# Patient Record
Sex: Female | Born: 1945 | Race: White | Hispanic: No | Marital: Married | Smoking: Former smoker
Health system: Southern US, Community
[De-identification: ages and names within clinical notes are randomized; demographics above are authoritative.]

## PROBLEM LIST (undated history)

## (undated) DIAGNOSIS — E785 Hyperlipidemia, unspecified: Secondary | ICD-10-CM

## (undated) DIAGNOSIS — E079 Disorder of thyroid, unspecified: Secondary | ICD-10-CM

## (undated) DIAGNOSIS — I10 Essential (primary) hypertension: Secondary | ICD-10-CM

## (undated) DIAGNOSIS — E039 Hypothyroidism, unspecified: Secondary | ICD-10-CM

## (undated) DIAGNOSIS — F419 Anxiety disorder, unspecified: Secondary | ICD-10-CM

## (undated) HISTORY — DX: Hypothyroidism, unspecified: E03.9

## (undated) HISTORY — DX: Essential (primary) hypertension: I10

## (undated) HISTORY — DX: Disorder of thyroid, unspecified: E07.9

## (undated) HISTORY — DX: Anxiety disorder, unspecified: F41.9

## (undated) HISTORY — DX: Hyperlipidemia, unspecified: E78.5

---

## 1978-10-25 DIAGNOSIS — E079 Disorder of thyroid, unspecified: Secondary | ICD-10-CM

## 1978-10-25 HISTORY — PX: THYROIDECTOMY: SHX17

## 1978-10-25 HISTORY — DX: Disorder of thyroid, unspecified: E07.9

## 1984-10-25 HISTORY — PX: ABDOMINAL HYSTERECTOMY: SHX81

## 2001-10-25 HISTORY — PX: CHOLECYSTECTOMY: SHX55

## 2002-08-13 ENCOUNTER — Ambulatory Visit (HOSPITAL_COMMUNITY): Admission: RE | Admit: 2002-08-13 | Discharge: 2002-08-13 | Payer: Self-pay | Admitting: Family Medicine

## 2002-08-13 ENCOUNTER — Encounter: Payer: Self-pay | Admitting: Family Medicine

## 2002-10-09 ENCOUNTER — Encounter: Payer: Self-pay | Admitting: Family Medicine

## 2002-10-09 ENCOUNTER — Ambulatory Visit (HOSPITAL_COMMUNITY): Admission: RE | Admit: 2002-10-09 | Discharge: 2002-10-09 | Payer: Self-pay | Admitting: Family Medicine

## 2002-10-10 ENCOUNTER — Ambulatory Visit (HOSPITAL_COMMUNITY): Admission: RE | Admit: 2002-10-10 | Discharge: 2002-10-10 | Payer: Self-pay | Admitting: General Surgery

## 2003-08-15 ENCOUNTER — Encounter: Payer: Self-pay | Admitting: Family Medicine

## 2003-08-15 ENCOUNTER — Ambulatory Visit (HOSPITAL_COMMUNITY): Admission: RE | Admit: 2003-08-15 | Discharge: 2003-08-15 | Payer: Self-pay | Admitting: Family Medicine

## 2004-07-27 ENCOUNTER — Other Ambulatory Visit: Admission: RE | Admit: 2004-07-27 | Discharge: 2004-07-27 | Payer: Self-pay | Admitting: Orthopaedic Surgery

## 2004-08-17 ENCOUNTER — Ambulatory Visit (HOSPITAL_COMMUNITY): Admission: RE | Admit: 2004-08-17 | Discharge: 2004-08-17 | Payer: Self-pay | Admitting: Family Medicine

## 2004-12-11 ENCOUNTER — Other Ambulatory Visit: Admission: RE | Admit: 2004-12-11 | Discharge: 2004-12-11 | Payer: Self-pay | Admitting: Orthopaedic Surgery

## 2005-08-19 ENCOUNTER — Ambulatory Visit (HOSPITAL_COMMUNITY): Admission: RE | Admit: 2005-08-19 | Discharge: 2005-08-19 | Payer: Self-pay | Admitting: Family Medicine

## 2006-08-22 ENCOUNTER — Ambulatory Visit (HOSPITAL_COMMUNITY): Admission: RE | Admit: 2006-08-22 | Discharge: 2006-08-22 | Payer: Self-pay | Admitting: Family Medicine

## 2006-11-11 ENCOUNTER — Ambulatory Visit (HOSPITAL_COMMUNITY): Admission: RE | Admit: 2006-11-11 | Discharge: 2006-11-11 | Payer: Self-pay | Admitting: Family Medicine

## 2007-08-25 ENCOUNTER — Ambulatory Visit (HOSPITAL_COMMUNITY): Admission: RE | Admit: 2007-08-25 | Discharge: 2007-08-25 | Payer: Self-pay | Admitting: Family Medicine

## 2008-03-29 ENCOUNTER — Ambulatory Visit (HOSPITAL_COMMUNITY): Admission: RE | Admit: 2008-03-29 | Discharge: 2008-03-29 | Payer: Self-pay | Admitting: Family Medicine

## 2008-08-27 ENCOUNTER — Ambulatory Visit (HOSPITAL_COMMUNITY): Admission: RE | Admit: 2008-08-27 | Discharge: 2008-08-27 | Payer: Self-pay | Admitting: Family Medicine

## 2009-04-21 ENCOUNTER — Encounter: Payer: Self-pay | Admitting: Internal Medicine

## 2009-04-30 ENCOUNTER — Ambulatory Visit: Payer: Self-pay | Admitting: Internal Medicine

## 2009-04-30 ENCOUNTER — Ambulatory Visit (HOSPITAL_COMMUNITY): Admission: RE | Admit: 2009-04-30 | Discharge: 2009-04-30 | Payer: Self-pay | Admitting: Internal Medicine

## 2009-04-30 HISTORY — PX: COLONOSCOPY: SHX174

## 2009-09-02 ENCOUNTER — Ambulatory Visit (HOSPITAL_COMMUNITY): Admission: RE | Admit: 2009-09-02 | Discharge: 2009-09-02 | Payer: Self-pay | Admitting: Family Medicine

## 2010-09-07 ENCOUNTER — Ambulatory Visit (HOSPITAL_COMMUNITY): Admission: RE | Admit: 2010-09-07 | Discharge: 2010-09-07 | Payer: Self-pay | Admitting: Family Medicine

## 2010-11-26 NOTE — Letter (Signed)
Summary: Internal Other Domingo Dimes  Internal Other Domingo Dimes   Imported By: Cloria Spring LPN 16/07/9603 54:09:81  _____________________________________________________________________  External Attachment:    Type:   Image     Comment:   External Document

## 2011-03-09 NOTE — Op Note (Signed)
NAME:  Kendra Hancock, SUNDBERG              ACCOUNT NO.:  [id number]   MEDICAL RECORD NO.:  [id number]          PATIENT TYPE:  AMB   LOCATION:  DAY                           FACILITY:  [facility]   PHYSICIAN:  R. Roetta Sessions, M.D. DATE OF BIRTH:  11/05/1945   DATE OF PROCEDURE:  04/30/2009  DATE OF DISCHARGE:                               OPERATIVE REPORT   Screening ileal colonoscopy.   INDICATIONS FOR PROCEDURE:  A 65 year old lady with no lower GU tract  symptoms who comes for colon cancer screening via colonoscopy out of the  courtesy of Dr. Nobie Putnam.  Risks, benefits, alternatives, and  limitations have been discussed, questions answered.  She is agreeable.  Please see documentation in the medical record.   PROCEDURE NOTE:  O2 saturation, blood pressure, pulse, respirations were  monitored throughout the entire procedure.  Conscious sedation with  Versed 6 mg IV, Demerol 100 mg IV in divided doses.  Pentax video chip  system.   FINDINGS:  Digital rectal exam revealed no abnormalities.   ENDOSCOPIC FINDINGS:  The prep was good.   Colon:  Colonic mucosa was surveyed from the rectosigmoid junction  through the left, transverse, right colon to the area of the appendiceal  orifice, ileocecal valve and cecum.  The cecum and appendiceal orifice  were photographed and well seen for the record.  The ileocecal valve was  subtle, and it was not very fatty at all, and the ileocecal valve was  actually patulous but otherwise normal appearing.  The ileocecal valve  was intubated to 5 cm.  The distal terminal ileal mucosa appeared  normal.  From this level, the scope was slowly and cautiously withdrawn.  All previously mentioned mucosal surfaces were again seen.  The patient  had scattered left-sided diverticula.  The remainder of the colonic  mucosa appeared normal.  Scope was pulled down to the rectum where  thorough examination of the rectal mucosa including retroflexion of the  anal verge  demonstrated no abnormalities.  The patient tolerated the  procedure well and was reactive to endoscopy.  Cecal withdrawal time 7  minutes 30 seconds.   IMPRESSION:  1. Normal rectum.  2. Scattered left-sided diverticula.  The remainder of the colonic      mucosa appeared normal.  Somewhat subtle patulous ileocecal valve.      Normal terminal ileum.   RECOMMENDATIONS:  1. Diverticulosis literature provided to Ms. Roseanne Reno.  2. Recommend repeat screening colonoscopy in 10 years.      Jonathon Bellows, M.D.  Electronically Signed     RMR/MEDQ  D:  04/30/2009  T:  04/30/2009  Job:  [id number]   cc:   Patrica Duel, M.D.  Fax: [phone]

## 2011-03-12 NOTE — H&P (Signed)
   NAME:  Kendra Hancock, Kendra Hancock NO.:  [id number]   MEDICAL RECORD NO.:  [id number]                  PATIENT TYPE:   LOCATION:                                       FACILITY:   PHYSICIAN:  Dalia Heading, M.D.               DATE OF BIRTH:  1946/03/15   DATE OF ADMISSION:  10/10/2002  DATE OF DISCHARGE:                                HISTORY & PHYSICAL   CHIEF COMPLAINT:  Cholecystitis secondary to cholelithiasis.   HISTORY OF PRESENT ILLNESS:  The patient is a 65 year old white female who  is referred for evaluation and treatment of cholecystitis secondary to  cholelithiasis.  She has been having right upper quadrant abdominal pain,  nausea, indigestion, and food intolerance for the past few weeks.  She had  an ultrasound of the gallbladder this morning which revealed stones and a  thickened gallbladder wall.  A normal common bile duct was found.  She does  have a positive Murphy's sign.  She denies any fever, chills, or jaundice.   PAST MEDICAL HISTORY:  Hypothyroidism and hypertension.   PAST SURGICAL HISTORY:  Partial thyroidectomy, hysterectomy.   CURRENT MEDICATIONS:  1. Lotrel 5/20 mg p.o. q.d.  2. Synthroid 0.075 mg p.o. q.d.  3. Odgen 1.25 mg p.o. q.d.  4. Hydrochlorothiazide 25 mg p.o. q.d.   ALLERGIES:  AMOXICILLIN.   REVIEW OF SYSTEMS:  Noncontributory.   PHYSICAL EXAMINATION:  GENERAL:  The patient is a well-developed, well-  nourished white female in no acute distress.  VITAL SIGNS:  She is afebrile and vital signs are stable.  HEENT:  Reveals no scleral icterus.  LUNGS:  Clear to auscultation with equal breath sounds bilaterally.  HEART:  Reveals a regular rate and rhythm without S3, S4, or murmurs.  ABDOMEN:  Soft and nondistended.  She is tender in the right upper quadrant  to palpation.  No hepatosplenomegaly, masses, or hernias are noted.   IMPRESSION:  Cholecystitis, cholelithiasis.    PLAN:  The patient is scheduled for  laparoscopic cholecystectomy on October 10, 2002.  The risks and benefits of the procedure including bleeding,  infection, hepatobiliary injury, the possibility of an open procedure were  fully explained to the patient who gave informed consent.                                               Dalia Heading, M.D.    MAJ/MEDQ  D:  10/09/2002  T:  10/10/2002  Job:  [id number]   cc:   Patrica Duel, M.D.  [address], [address]  [place]  [place] [address]  Fax: [phone]

## 2011-03-12 NOTE — Op Note (Signed)
NAME:  Kendra Hancock, Kendra Hancock                        ACCOUNT NO.:  [id number]   MEDICAL RECORD NO.:  [id number]                   PATIENT TYPE:  AMB   LOCATION:  DAY                                  FACILITY:  [facility]   PHYSICIAN:  Dalia Heading, M.D.               DATE OF BIRTH:  08-11-1946   DATE OF PROCEDURE:  10/10/2002  DATE OF DISCHARGE:                                 OPERATIVE REPORT   PREOPERATIVE DIAGNOSIS:  Cholecystitis, cholelithiasis.   POSTOPERATIVE DIAGNOSIS:  Cholecystitis, cholelithiasis.   PROCEDURE:  Laparoscopic cholecystectomy.   SURGEON:  Dalia Heading, M.D.   ASSISTANT:  Bernerd Limbo. Leona Carry, M.D.   ANESTHESIA:  General endotracheal.   COMPLICATIONS:  None.   SPECIMENS:  Gallbladder with stone.   ESTIMATED BLOOD LOSS:  Minimal.   INDICATIONS:  The patient is a 65 year old white female who is referred for  evaluation and treatment of cholecystitis secondary to cholelithiasis.  The  risks and benefits of the procedure, including bleeding, infection,  hepatobiliary injury, and the possibility of an open procedure, were fully  explained to the patient, who gave informed consent.   DESCRIPTION OF PROCEDURE:  The patient was placed in the supine position.  After the induction of general endotracheal anesthesia, the abdomen was  prepped and draped using the usual sterile technique with Betadine.  Surgical site confirmation was performed.   A supraumbilical incision was made down to the fascia.  A Veress needle was  introduced into the abdominal cavity, and confirmation of placement was done  using the saline drop test.  The abdomen was then insufflated to 16 mmHg  pressure.  An 11 mm trocar was introduced into the abdominal cavity under  direct visualization without difficulty.  The patient was placed in reverse  Trendelenburg position and an additional 11 mm trocar was placed in the  epigastric region and 5 mm trocars were placed in the right upper quadrant  and right flank regions.  The liver was inspected and noted to be within  normal limits.  The gallbladder was retracted superiorly and laterally.  The  dissection was done begun around the infundibulum of the gallbladder.  The  cystic duct was first identified and its junction to the infundibulum fully  identified.  Endoclips were placed proximally and distally on the cystic  duct, and the cystic duct was divided.  This was likewise done on the cystic  artery.  The gallbladder was then freed away from the gallbladder fossa  using Bovie electrocautery.  The gallbladder was delivered through the  epigastric trocar site using the EndoCatch bag.  The gallbladder fossa was  inspected, and no abnormal bleeding or bile leakage was noted.  Surgicel was  placed in the gallbladder fossa.  The subhepatic space as well as right  hepatic gutter were irrigated with normal saline.  All fluid and air were  then evacuated from the abdominal cavity prior  to removal of the trocars.   All wounds were irrigated with normal saline.  All wounds were injected with  0.5% Sensorcaine.  The supraumbilical fascia was reapproximated using an 0  Vicryl interrupted suture.  All skin incisions were closed using staples.  Betadine ointment and dry sterile dressings were applied.   All tape and needle counts were correct at the end of the procedure.  The  patient was extubated in the operating room and went back to the recovery  room awake, in stable condition.                                               Dalia Heading, M.D.    MAJ/MEDQ  D:  10/10/2002  T:  10/10/2002  Job:  [id number]   cc:   Patrica Duel, M.D.  [address]  [place]  [place] [address]  Fax: [phone]

## 2011-05-26 ENCOUNTER — Ambulatory Visit (HOSPITAL_COMMUNITY)
Admission: RE | Admit: 2011-05-26 | Discharge: 2011-05-26 | Disposition: A | Discharge status: Home / Self Care | Payer: Medicare Other | Source: Ambulatory Visit | Attending: Cardiology | Admitting: Cardiology

## 2011-05-26 ENCOUNTER — Ambulatory Visit (INDEPENDENT_AMBULATORY_CARE_PROVIDER_SITE_OTHER): Payer: BC Managed Care – HMO | Admitting: Cardiology

## 2011-05-26 ENCOUNTER — Encounter: Payer: Self-pay | Admitting: *Deleted

## 2011-05-26 ENCOUNTER — Encounter: Payer: Self-pay | Admitting: Cardiology

## 2011-05-26 DIAGNOSIS — R079 Chest pain, unspecified: Secondary | ICD-10-CM | POA: Insufficient documentation

## 2011-05-26 DIAGNOSIS — I10 Essential (primary) hypertension: Secondary | ICD-10-CM

## 2011-05-26 DIAGNOSIS — R0602 Shortness of breath: Secondary | ICD-10-CM | POA: Insufficient documentation

## 2011-05-26 DIAGNOSIS — Z7901 Long term (current) use of anticoagulants: Secondary | ICD-10-CM

## 2011-05-26 DIAGNOSIS — Z01818 Encounter for other preprocedural examination: Secondary | ICD-10-CM | POA: Insufficient documentation

## 2011-05-26 DIAGNOSIS — E785 Hyperlipidemia, unspecified: Secondary | ICD-10-CM | POA: Insufficient documentation

## 2011-05-26 LAB — CBC WITH DIFFERENTIAL/PLATELET
Basophils Absolute: 0.1 10*3/uL (ref 0.0–0.1)
Basophils Relative: 1 % (ref 0–1)
Eosinophils Absolute: 0.1 10*3/uL (ref 0.0–0.7)
Eosinophils Relative: 1 % (ref 0–5)
HCT: 38.6 % (ref 36.0–46.0)
Hemoglobin: 13.1 g/dL (ref 12.0–15.0)
Lymphocytes Relative: 39 % (ref 12–46)
Lymphs Abs: 2.8 10*3/uL (ref 0.7–4.0)
MCH: 30.1 pg (ref 26.0–34.0)
MCHC: 33.9 g/dL (ref 30.0–36.0)
MCV: 88.7 fL (ref 78.0–100.0)
Monocytes Absolute: 0.4 10*3/uL (ref 0.1–1.0)
Monocytes Relative: 6 % (ref 3–12)
Neutro Abs: 3.7 10*3/uL (ref 1.7–7.7)
Neutrophils Relative %: 53 % (ref 43–77)
Platelets: 356 10*3/uL (ref 150–400)
RBC: 4.35 MIL/uL (ref 3.87–5.11)
RDW: 12.8 % (ref 11.5–15.5)
WBC: 7.1 10*3/uL (ref 4.0–10.5)

## 2011-05-26 LAB — PROTIME-INR
INR: 0.96 (ref <1.50)
Prothrombin Time: 13 seconds (ref 11.6–15.2)

## 2011-05-26 LAB — BASIC METABOLIC PANEL
BUN: 12 mg/dL (ref 6–23)
CO2: 25 mEq/L (ref 19–32)
Calcium: 10 mg/dL (ref 8.4–10.5)
Chloride: 101 mEq/L (ref 96–112)
Creat: 0.67 mg/dL (ref 0.50–1.10)
Glucose, Bld: 101 mg/dL — ABNORMAL HIGH (ref 70–99)
Potassium: 4.5 mEq/L (ref 3.5–5.3)
Sodium: 139 mEq/L (ref 135–145)

## 2011-05-26 LAB — APTT: aPTT: 36 seconds (ref 24–37)

## 2011-05-26 NOTE — Assessment & Plan Note (Addendum)
Blood pressure control is suboptimal at this visit, but patient attributes that to anxiety.  She has had good values when assessed at home in recent weeks.  No changes will be made to her antihypertensive regimen at present.  I will reassess this nice woman once cardiac catheterization has been completed.

## 2011-05-26 NOTE — Patient Instructions (Signed)
A chest x-ray takes a picture of the organs and structures inside the chest, including the heart, lungs, and blood vessels. This test can show several things, including, whether the heart is enlarges; whether fluid is building up in the lungs; and whether pacemaker / defibrillator leads are still in place.  Your physician recommends that you return for lab work in: today  Your physician has requested that you have a cardiac catheterization. Cardiac catheterization is used to diagnose and/or treat various heart conditions. Doctors may recommend this procedure for a number of different reasons. The most common reason is to evaluate chest pain. Chest pain can be a symptom of coronary artery disease (CAD), and cardiac catheterization can show whether plaque is narrowing or blocking your heart's arteries. This procedure is also used to evaluate the valves, as well as measure the blood flow and oxygen levels in different parts of your heart. For further information please visit [email or web address] Please follow instruction sheet, as given.  Your physician recommends that you schedule a follow-up appointment in: after cath  No strenuous activity and call 911 for prolonged chest pain

## 2011-05-26 NOTE — Progress Notes (Signed)
HPI:  Ms. Buren is seen in the office today at the kind request of Dr. Phillips Odor for evaluation of chest discomfort.  This nice woman has a history of hypertension and hyperlipidemia that have been under excellent control.  She has a remote history of modest tobacco use and a  positive family history for coronary and cerebrovascular disease.  She has not had diabetes nor any known vascular abnormalities.  Over the past 2 months she has developed mild to moderate mid substernal chest pressure when walking rapidly on flat ground or at a normal pace up hill.  She has had some dyspnea as well, not necessarily associated with chest discomfort or exercise.  Discomfort resolves almost immediately with rest.  She has had nitroglycerin for the past few days, but has not used it, as discomfort is so fleeting.  There is been no diaphoresis nor nausea.  No radiation is described.  She has never had similar symptoms, never undergone any significant cardiac testing nor has she been previously evaluated by a cardiologist.  No current outpatient prescriptions on file prior to visit.    Allergies  Allergen Reactions  . Amoxicillin Rash      Past Medical History  Diagnosis Date  . Hypertension     01/2011-normal CBC, CMetand TSH  . Hyperlipidemia     Lipid profile: 162, 83, 58, 87 in 01/2011  . Chest pain 03/2011    Exertional and associated with dizziness and dyspnea     Past Surgical History  Procedure Date  . Cholecystectomy 2003    Laparoscopic  . Abdominal hysterectomy   . Thyroidectomy   . Colonoscopy 2003    few diverticula     No family history on file.   History   Social History  . Marital Status: Married    Spouse Name: N/A    Number of Children: N/A  . Years of Education: N/A   Occupational History  . Not on file.   Social History Main Topics  . Smoking status: Former Games developer  . Smokeless tobacco: Never Used  . Alcohol Use: No  . Drug Use: No  . Sexually Active: Not on file    Other Topics Concern  . Not on file   Social History Narrative  . No narrative on file     ROS:   Occasional dizziness,   All other systems reviewed and are negative.  PHYSICAL EXAM: BP 164/79  Pulse 63  Ht 5\' 2"  (1.575 m)  Wt 67.586 kg (149 lb)  BMI 27.25 kg/m2  SpO2 100%  General-Well-developed; no acute distress Body Habitus-proportionate weight and height HEENT-/AT; PERRL; EOM intact; conjunctiva and lids nl Neck-No JVD; no carotid bruits Endocrine-No thyromegaly Lungs-Clear lung fields; resonant percussion; normal I-to-E ratio Cardiovascular- normal PMI; normal S1 and S2; modest systolic ejection murmur Abdomen-BS normal; soft and non-tender without masses or organomegaly Musculoskeletal-No deformities, cyanosis or clubbing Neurologic-Nl cranial nerves; symmetric strength and tone Skin- Warm, no significant lesions Extremities-Nl distal pulses; no edema  EKG:  Sinus bradycardia at a rate of 59; left atrial abnormality; delayed R-wave progression.  No previous tracing for comparison.   ASSESSMENT AND PLAN:

## 2011-05-26 NOTE — Assessment & Plan Note (Addendum)
Patient's description of chest discomfort is classic for exertional angina.  There has been some progression of symptoms such that discomfort is occurring at a lower exercise threshold, but the change has not been dramatic.  With brief and self-limited episodes only occurring during exertion, she does not appear to be at particular risk for a major morbidity event.  I explained the benefits and risks of cardiac catheterization vs empiric medical therapy.  In order to define her anatomy and exclude high risk disease, I've recommended cardiac catheterization.  She and her husband agreed to that course of action.  Precatheterization laboratories will be obtained.  She has been advised to limit activity and use nitroglycerin for any episodes that not pass quickly.  For prolonged or severe symptoms, she is advised to call EMS.  Outpatient catheterization will be arranged to proceed within the next week.

## 2011-05-26 NOTE — Assessment & Plan Note (Signed)
Hyperlipidemia must be mild, since lipid values are good with a minimal dosage of simvastatin.  I would be inclined to intensify lipid-lowering therapy, particularly if angiography documents coronary disease as expected.

## 2011-05-31 ENCOUNTER — Inpatient Hospital Stay (HOSPITAL_BASED_OUTPATIENT_CLINIC_OR_DEPARTMENT_OTHER)
Admission: RE | Admit: 2011-05-31 | Discharge: 2011-05-31 | Disposition: A | Discharge status: Home / Self Care | Payer: Medicare Other | Source: Ambulatory Visit | Attending: Cardiovascular Disease | Admitting: Cardiovascular Disease

## 2011-05-31 DIAGNOSIS — E785 Hyperlipidemia, unspecified: Secondary | ICD-10-CM | POA: Insufficient documentation

## 2011-05-31 DIAGNOSIS — R079 Chest pain, unspecified: Secondary | ICD-10-CM

## 2011-05-31 DIAGNOSIS — R0609 Other forms of dyspnea: Secondary | ICD-10-CM | POA: Insufficient documentation

## 2011-05-31 DIAGNOSIS — I10 Essential (primary) hypertension: Secondary | ICD-10-CM | POA: Insufficient documentation

## 2011-05-31 DIAGNOSIS — R0989 Other specified symptoms and signs involving the circulatory and respiratory systems: Secondary | ICD-10-CM | POA: Insufficient documentation

## 2011-06-01 ENCOUNTER — Ambulatory Visit: Payer: BC Managed Care – HMO | Admitting: Adult Health

## 2011-06-01 NOTE — Cardiovascular Report (Signed)
  NAME:  Kendra Hancock, Kendra Hancock NO.:  [id number]  MEDICAL RECORD NO.:  [id number]  LOCATION:                                 FACILITY:  PHYSICIAN:  Verne Carrow, MDDATE OF BIRTH:  [birth year]  DATE OF PROCEDURE:  05/31/2011 DATE OF DISCHARGE:                           CARDIAC CATHETERIZATION   PRIMARY CARDIOLOGIST:  Gerrit Friends. Dietrich Pates, MD, [facility]  PRIMARY CARE PHYSICIAN:  Corrie Mckusick, MD  PROCEDURES PERFORMED: 1. Left heart catheterization. 2. Selective coronary angiography. 3. Left ventricular angiogram.  OPERATOR:  Verne Carrow, MD  INDICATIONS:  This is a 65 year old Caucasian female with a history of hypertension and hyperlipidemia who has recently had episodes of exertional chest pain associated with dyspnea.  She was referred for diagnostic catheterization by Dr. Teague Bing.  PROCEDURE IN DETAIL:  The patient was brought to the outpatient cardiac catheterization laboratory after signing informed consent for the procedure.  The right groin was prepped and draped in sterile fashion. Lidocaine 1% was used for local anesthesia.  A 4-French sheath was inserted into the right femoral artery without difficulty.  Standard diagnostic catheters were used to perform selective coronary angiography.  A pigtail catheter was used to perform a left ventricular angiogram.  The patient tolerated the procedure well and was taken to the recovery room in stable condition.  HEMODYNAMIC FINDINGS:  Central aortic pressure 139/57, left ventricular pressure 144/50/17.  ANGIOGRAPHIC FINDINGS: 1. The left main coronary artery was free of any disease. 2. The left anterior descending was a moderate-sized vessel that     coursed to the apex and wrapped around the apex.  There was a     moderate-sized diagonal branch that arose from the LAD.  There was     no disease in this system. 3. The circumflex artery gave off several small-caliber obtuse     marginal  branches.  The AV groove circumflex was moderate in size.     This was a dominant system.  There was no evidence of disease in     this system. 4. The right coronary artery was a small nondominant vessel with no     disease. 5. Left ventricular angiogram was performed in the RAO projection and     showed normal left ventricular systolic function with ejection     fraction of 55-60%.  IMPRESSION: 1. No angiographic evidence of coronary artery disease. 2. Normal left ventricular systolic function.  RECOMMENDATIONS:  I do not recommend any further ischemic workup at this time.     Verne Carrow, MD     CM/MEDQ  D:  05/31/2011  T:  05/31/2011  Job:  [id number]  cc:   Gerrit Friends. Dietrich Pates, MD, [facility] Corrie Mckusick, M.D.  Electronically Signed by Verne Carrow MD on 06/01/2011 01:38:34 PM

## 2011-06-18 ENCOUNTER — Encounter: Payer: Self-pay | Admitting: *Deleted

## 2011-06-18 ENCOUNTER — Telehealth: Payer: Self-pay | Admitting: *Deleted

## 2011-06-18 NOTE — Telephone Encounter (Signed)
Message copied by Mikaia Janvier L on Fri Jun 18, 2011 12:10 PM ------      Message from: Kathlen Brunswick      Created: Thu May 27, 2011 11:17 AM       Lab results reviewed.      No change in Rx.      No significant abnormalities.

## 2011-06-18 NOTE — Telephone Encounter (Signed)
No answer on phone number listed. Will mail letter to pt.

## 2011-06-25 ENCOUNTER — Ambulatory Visit (INDEPENDENT_AMBULATORY_CARE_PROVIDER_SITE_OTHER): Payer: Medicare Other | Admitting: Cardiology

## 2011-06-25 ENCOUNTER — Encounter: Payer: Self-pay | Admitting: Cardiology

## 2011-06-25 DIAGNOSIS — E785 Hyperlipidemia, unspecified: Secondary | ICD-10-CM

## 2011-06-25 DIAGNOSIS — I10 Essential (primary) hypertension: Secondary | ICD-10-CM

## 2011-06-25 DIAGNOSIS — R079 Chest pain, unspecified: Secondary | ICD-10-CM

## 2011-06-25 NOTE — Progress Notes (Signed)
HPI : Ms. Bristow returns to the office following cardiac catheterization.  The results of that study were entirely benign with no coronary artery disease identified.  Since her last office visit, she has had no recurrent chest discomfort and feels fine.  Current Outpatient Prescriptions on File Prior to Visit  Medication Sig Dispense Refill  . amLODipine (NORVASC) 5 MG tablet Take 5 mg by mouth daily.        Marland Kitchen aspirin 81 MG tablet Take 81 mg by mouth daily.        Marland Kitchen estropipate (OGEN) 1.5 MG tablet Take 1.5 mg by mouth daily.       . hydrochlorothiazide 25 MG tablet Take 25 mg by mouth daily.       Marland Kitchen levothyroxine (SYNTHROID, LEVOTHROID) 75 MCG tablet Take 75 mcg by mouth daily.       Marland Kitchen NITROSTAT 0.4 MG SL tablet Place 0.4 mg under the tongue every 5 (five) minutes as needed.       . simvastatin (ZOCOR) 10 MG tablet Take 10 mg by mouth at bedtime.           Allergies  Allergen Reactions  . Amoxicillin Rash      Past medical history, social history, and family history reviewed and updated.  ROS: No dyspnea, orthopnea, PND, pedal edema or discomfort at the cardiac catheterization site.  PHYSICAL EXAM: BP 164/66  Pulse 53  Resp 18  Ht 5\' 3"  (1.6 m)  Wt 68.402 kg (150 lb 12.8 oz)  BMI 26.71 kg/m2  SpO2 96%  General-Well developed; no acute distress Body habitus-proportionate weight and height Neck-No JVD; no carotid bruits Lungs-clear lung fields; resonant to percussion Cardiovascular-normal PMI; normal S1 and S2; grade 1/6 early systolic ejection murmur at the cardiac base Abdomen-normal bowel sounds; soft and non-tender without masses or organomegaly Musculoskeletal-No deformities, no cyanosis or clubbing Neurologic-Normal cranial nerves; symmetric strength and tone Skin-Warm, no significant lesions Extremities-distal pulses intact; no edema; no bruising or other abnormality at the right femoral artery catheterization site   Lab:  CBC, complete metabolic profile, PT,  PTT-normal CXR:  Calcification in the aortic arch; scoliosis; right upper quadrant surgical clips  ASSESSMENT AND PLAN:

## 2011-06-25 NOTE — Assessment & Plan Note (Signed)
Patient appears to have a component of white coat hypertension, but reports frequent blood pressures at home all with systolics less than 140.  She does not require any change in her antihypertensive medication.

## 2011-06-25 NOTE — Assessment & Plan Note (Signed)
Chest pain was quite worrisome in terms of its quality and relationship to exertion, but apparently did not represent angina.  She does not appear to meet criteria for syndrome X. Other cardiac issues such as coronary artery spasm continue to represent possible diagnoses, but in the absence of symptoms I would not be inclined to pursue further diagnostic testing or treatment.  I will see this nice woman again on a p.r.n basis.

## 2011-06-25 NOTE — Patient Instructions (Signed)
Your physician recommends that you continue on your current medications as directed. Please refer to the Current Medication list given to you today.  Your physician recommends that you schedule a follow-up appointment in: as needed  

## 2011-06-25 NOTE — Assessment & Plan Note (Signed)
Lipid profile: 162, 83, 58, 87 in 01/2011 Excellent control.

## 2011-08-10 ENCOUNTER — Other Ambulatory Visit (HOSPITAL_COMMUNITY): Payer: Self-pay | Admitting: Family Medicine

## 2011-08-10 DIAGNOSIS — Z139 Encounter for screening, unspecified: Secondary | ICD-10-CM

## 2011-08-23 ENCOUNTER — Other Ambulatory Visit (HOSPITAL_COMMUNITY): Payer: Self-pay | Admitting: Family Medicine

## 2011-08-23 DIAGNOSIS — Z139 Encounter for screening, unspecified: Secondary | ICD-10-CM

## 2011-09-13 ENCOUNTER — Ambulatory Visit (HOSPITAL_COMMUNITY)
Admission: RE | Admit: 2011-09-13 | Discharge: 2011-09-13 | Disposition: A | Discharge status: Home / Self Care | Payer: Medicare Other | Source: Ambulatory Visit | Attending: Family Medicine | Admitting: Family Medicine

## 2011-09-13 DIAGNOSIS — Z139 Encounter for screening, unspecified: Secondary | ICD-10-CM

## 2011-09-13 DIAGNOSIS — Z780 Asymptomatic menopausal state: Secondary | ICD-10-CM | POA: Insufficient documentation

## 2011-09-13 DIAGNOSIS — Z1231 Encounter for screening mammogram for malignant neoplasm of breast: Secondary | ICD-10-CM | POA: Insufficient documentation

## 2011-09-13 DIAGNOSIS — M949 Disorder of cartilage, unspecified: Secondary | ICD-10-CM | POA: Insufficient documentation

## 2011-09-13 DIAGNOSIS — M899 Disorder of bone, unspecified: Secondary | ICD-10-CM | POA: Insufficient documentation

## 2011-10-04 ENCOUNTER — Encounter: Payer: Self-pay | Admitting: Internal Medicine

## 2011-10-06 ENCOUNTER — Encounter: Payer: Self-pay | Admitting: Urgent Care

## 2011-10-06 ENCOUNTER — Ambulatory Visit (INDEPENDENT_AMBULATORY_CARE_PROVIDER_SITE_OTHER): Payer: Medicare Other | Admitting: Urgent Care

## 2011-10-06 VITALS — BP 144/74 | HR 60 | Temp 97.9°F | Ht 62.0 in | Wt 150.8 lb

## 2011-10-06 DIAGNOSIS — K921 Melena: Secondary | ICD-10-CM

## 2011-10-06 DIAGNOSIS — K649 Unspecified hemorrhoids: Secondary | ICD-10-CM

## 2011-10-06 MED ORDER — HYDROCORTISONE ACETATE 25 MG RE SUPP: 25.0000 mg | Freq: Two times a day (BID) | RECTAL | Status: AC

## 2011-10-06 NOTE — Assessment & Plan Note (Addendum)
Kendra Hancock is a pleasant 65 y.o. female with rectal bleeding felt to be due to active hemorrhoids. Will treat her hemorrhoids and if she remains symptomatic, she will need further evaluation. Last colonoscopy 2010, she did have diverticulosis. Anusol HC suppositories 1 per rectum twice a day for 10 days If persistent bleeding or new symptoms, she should call us Hemorrhoid literature

## 2011-10-06 NOTE — Patient Instructions (Signed)
You have a large hemorrhoid. Anusol-HC suppositories 1 per rectum twice a day. Call if the bleeding persists or he develop any new symptoms.  Hemorrhoids Hemorrhoids are enlarged (dilated) veins around the rectum. There are 2 types of hemorrhoids, and the type of hemorrhoid is determined by its location. Internal hemorrhoids occur in the veins just inside the rectum.They are usually not painful, but they may bleed.However, they may poke through to the outside and become irritated and painful. External hemorrhoids involve the veins outside the anus and can be felt as a painful swelling or hard lump near the anus.They are often itchy and may crack and bleed. Sometimes clots will form in the veins. This makes them swollen and painful. These are called thrombosed hemorrhoids. CAUSES Causes of hemorrhoids include:  Pregnancy. This increases the pressure in the hemorrhoidal veins.   Constipation.   Straining to have a bowel movement.   Obesity.   Heavy lifting or other activity that caused you to strain.  TREATMENT Most of the time hemorrhoids improve in 1 to 2 weeks. However, if symptoms do not seem to be getting better or if you have a lot of rectal bleeding, your caregiver may perform a procedure to help make the hemorrhoids get smaller or remove them completely.Possible treatments include:  Rubber band ligation. A rubber band is placed at the base of the hemorrhoid to cut off the circulation.   Sclerotherapy. A chemical is injected to shrink the hemorrhoid.   Infrared light therapy. Tools are used to burn the hemorrhoid.   Hemorrhoidectomy. This is surgical removal of the hemorrhoid.  HOME CARE INSTRUCTIONS   Increase fiber in your diet. Ask your caregiver about using fiber supplements.   Drink enough water and fluids to keep your urine clear or pale yellow.   Exercise regularly.   Go to the bathroom when you have the urge to have a bowel movement. Do not wait.   Avoid  straining to have bowel movements.   Keep the anal area dry and clean.   Only take over-the-counter or prescription medicines for pain, discomfort, or fever as directed by your caregiver.  If your hemorrhoids are thrombosed:  Take warm sitz baths for 20 to 30 minutes, 3 to 4 times per day.   If the hemorrhoids are very tender and swollen, place ice packs on the area as tolerated. Using ice packs between sitz baths may be helpful. Fill a plastic bag with ice. Place a towel between the bag of ice and your skin.   Medicated creams and suppositories may be used or applied as directed.   Do not use a donut-shaped pillow or sit on the toilet for long periods. This increases blood pooling and pain.  SEEK MEDICAL CARE IF:   You have increasing pain and swelling that is not controlled with your medicine.   You have uncontrolled bleeding.   You have difficulty or you are unable to have a bowel movement.   You have pain or inflammation outside the area of the hemorrhoids.   You have chills or an oral temperature above 102 F (38.9 C).  MAKE SURE YOU:   Understand these instructions.   Will watch your condition.   Will get help right away if you are not doing well or get worse.  Document Released: 10/08/2000 Document Revised: 06/23/2011 Document Reviewed: 02/13/2008 [facility] Patient Information 2012 [place], [place].

## 2011-10-06 NOTE — Progress Notes (Signed)
Referring Provider: Colette Ribas, MD Primary Care Physician:  Colette Ribas, MD Primary Gastroenterologist:  Dr. Jena Gauss  Chief Complaint  Patient presents with  . Rectal Bleeding  . Hemorrhoids    HPI:  Kendra Hancock is a 65 y.o. female here as a referral from Dr. Phillips Odor for rectal bleeding.  She went to the bathroom 6 days ago and noticed a large amount of blood in the commode. The following day she had intermittent bleeding off and on for several days. She noticed the blood was mixed with her bowel movement as well as in the commode and on the tissue. She described it as bright red blood ranges from small to large amts w/ clots.  Denies proctalgia or rectal pruritis.  Denies abd pain.  Denies constipation or diarrhea.  Denies N/V.  Denies HB or indigestion.  Dysphagia or odynophagia.  Wt stable.  Appetite.  Denies fatigue.  IBU 200mg  once week aching in legs.    Past Medical History  Diagnosis Date  . Hypertension     01/2011-normal CBC, CMetand TSH  . Hyperlipidemia     Lipid profile: 162, 83, 58, 87 in 01/2011  . Chest pain 03/2011    Exertional and associated with dizziness and dyspnea  . Thyroid disease 1980    s/p partial thyroidectomy   Past Surgical History  Procedure Date  . Cholecystectomy 2003    Laparoscopic  . Abdominal hysterectomy 1986  . Thyroidectomy 1980  . Colonoscopy 04/30/2009    normal rectum/ left sided diverticula/subtle patulous ICV   Current Outpatient Prescriptions  Medication Sig Dispense Refill  . amLODipine (NORVASC) 5 MG tablet Take 5 mg by mouth daily.        Marland Kitchen aspirin 81 MG tablet Take 81 mg by mouth daily.        Marland Kitchen estropipate (OGEN) 1.5 MG tablet Take 1.5 mg by mouth daily.       . hydrochlorothiazide 25 MG tablet Take 25 mg by mouth daily.       Marland Kitchen levothyroxine (SYNTHROID, LEVOTHROID) 75 MCG tablet Take 75 mcg by mouth daily.       Marland Kitchen lisinopril (PRINIVIL,ZESTRIL) 20 MG tablet Take 20 mg by mouth daily.        Marland Kitchen NITROSTAT 0.4 MG SL  tablet Place 0.4 mg under the tongue every 5 (five) minutes as needed.       . simvastatin (ZOCOR) 10 MG tablet Take 10 mg by mouth at bedtime.         Allergies as of 10/06/2011 - Review Complete 10/06/2011  Allergen Reaction Noted  . Amoxicillin Rash 05/26/2011   Family History  Problem Relation Age of Onset  . Heart failure Mother   . Stroke Father    History   Social History  . Marital Status: Married    Spouse Name: N/A    Number of Children: 1  . Years of Education: N/A   Occupational History  . Homemaker    Social History Main Topics  . Smoking status: Former Smoker    Types: Cigarettes    Quit date: 10/25/1978  . Smokeless tobacco: Never Used  . Alcohol Use: No  . Drug Use: No  . Sexually Active: Not on file  Review of Systems: Gen: Denies any fever, chills, sweats, anorexia, fatigue, weakness, malaise, weight loss, and sleep disorder CV: Denies chest pain, angina, palpitations, syncope, orthopnea, PND, peripheral edema, and claudication. Resp: Denies dyspnea at rest, dyspnea with exercise, cough, sputum, wheezing, coughing up  blood, and pleurisy. GI: Denies vomiting blood, jaundice, and fecal incontinence.   Denies dysphagia or odynophagia. GU : Denies urinary burning, blood in urine, urinary frequency, urinary hesitancy, nocturnal urination, and urinary incontinence. MS: Denies joint pain, limitation of movement, and swelling, stiffness, low back pain, extremity pain. Denies muscle weakness, cramps, atrophy.  Derm: Denies rash, itching, dry skin, hives, moles, warts, or unhealing ulcers.  Psych: Denies depression, anxiety, memory loss, suicidal ideation, hallucinations, paranoia, and confusion. Heme: Denies bruising, and enlarged lymph nodes. Neuro:  Denies any headaches, dizziness, paresthesias. Endo:  Denies any problems with DM, thyroid, adrenal function.  Physical Exam: BP 144/74  Pulse 60  Temp(Src) 97.9 F (36.6 C) (Temporal)  Ht 5\' 2"  (1.575 m)  Wt  150 lb 12.8 oz (68.402 kg)  BMI 27.58 kg/m2 General:   Alert,  Well-developed, well-nourished, pleasant and cooperative in NAD Head:  Normocephalic and atraumatic. Eyes:  Sclera clear, no icterus.   Conjunctiva pink. Ears:  Normal auditory acuity. Nose:  No deformity, discharge, or lesions. Mouth:  No deformity or lesions,oropharynx pink & moist. Neck:  Supple; no masses or thyromegaly. Lungs:  Clear throughout to auscultation.   No wheezes, crackles, or rhonchi. No acute distress. Heart:  Regular rate and rhythm; no murmurs, clicks, rubs,  or gallops. Abdomen:  Normal bowel sounds.  No bruits.  Soft, non-tender and non-distended without masses, hepatosplenomegaly or hernias noted.  No guarding or rebound tenderness.   Rectal:  Multiple small hemorrhoids and one large nonbleeding, nontender external hemorrhoid at 9 o' clock. Msk:  Symmetrical without gross deformities. Normal posture. Pulses:  Normal pulses noted. Extremities:  No clubbing or edema. Neurologic:  Alert and oriented x4;  grossly normal neurologically. Skin:  Intact without significant lesions or rashes. Lymph Nodes:  No significant cervical adenopathy. Psych:  Alert and cooperative. Normal mood and affect.

## 2011-10-06 NOTE — Assessment & Plan Note (Signed)
Suspected secondary to large hemorrhoids on exam.

## 2011-10-07 NOTE — Progress Notes (Signed)
Cc to PCP 

## 2012-08-09 ENCOUNTER — Other Ambulatory Visit (HOSPITAL_COMMUNITY): Payer: Self-pay | Admitting: Family Medicine

## 2012-08-09 DIAGNOSIS — Z139 Encounter for screening, unspecified: Secondary | ICD-10-CM

## 2012-09-15 ENCOUNTER — Ambulatory Visit (HOSPITAL_COMMUNITY)
Admission: RE | Admit: 2012-09-15 | Discharge: 2012-09-15 | Disposition: A | Discharge status: Home / Self Care | Payer: Medicare Other | Source: Ambulatory Visit | Attending: Family Medicine | Admitting: Family Medicine

## 2012-09-15 DIAGNOSIS — Z139 Encounter for screening, unspecified: Secondary | ICD-10-CM

## 2012-09-15 DIAGNOSIS — Z1231 Encounter for screening mammogram for malignant neoplasm of breast: Secondary | ICD-10-CM | POA: Insufficient documentation

## 2012-11-09 ENCOUNTER — Ambulatory Visit (INDEPENDENT_AMBULATORY_CARE_PROVIDER_SITE_OTHER): Payer: Medicare Other | Admitting: Orthopedic Surgery

## 2012-11-09 ENCOUNTER — Ambulatory Visit (INDEPENDENT_AMBULATORY_CARE_PROVIDER_SITE_OTHER): Payer: Medicare Other

## 2012-11-09 ENCOUNTER — Encounter: Payer: Self-pay | Admitting: Orthopedic Surgery

## 2012-11-09 VITALS — BP 148/58 | Ht 62.0 in | Wt 151.8 lb

## 2012-11-09 DIAGNOSIS — M7712 Lateral epicondylitis, left elbow: Secondary | ICD-10-CM

## 2012-11-09 DIAGNOSIS — M25529 Pain in unspecified elbow: Secondary | ICD-10-CM

## 2012-11-09 DIAGNOSIS — M7710 Lateral epicondylitis, unspecified elbow: Secondary | ICD-10-CM

## 2012-11-09 NOTE — Patient Instructions (Signed)
Apply Max freeze 3 x a day   Follow home exercises daily   Wear brace x 6 weeks

## 2012-11-09 NOTE — Progress Notes (Signed)
Patient ID: Kendra Hancock, female   DOB: 1945-11-30, 67 y.o.   MRN: [id number] Chief Complaint  Patient presents with  . Elbow Pain    Left elbow pain, bone feels sore, no injury    Medical history recorded and reviewed.  Review of systems negative.  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 Her pain came on gradually he is described as dull Travatan constant pain, worse in the morning, associated with stiffness and loss of motion, especially elbow extension.  BP 148/58  Ht 5\' 2"  (1.575 m)  Wt 151 lb 12.8 oz (68.856 kg)  BMI 27.76 kg/m2 Physical Exam(12)  GENERAL: normal development   CDV: pulses are normal   Skin: normal  Lymph: nodes were not palpable/normal  Psychiatric: awake, alert and oriented  Neuro: normal sensation  MSK  Gait: Is normal 1 Inspection There is no swelling, but there is tenderness over the lateral epicondyles and lateral elbow musculature 2 Range of Motion Range of motion shows extension deficit 3 Motor Grade 5 4 Stability Normal  Other side:  5 Normal motion 6 Appearance is normal  Imaging Normal  Assessment: Lateral epicondylitis, LEFT elbow    Plan: Recommend injection, a tennis elbow brace, maxillary, 3 times a day, and [facility] home exercises x6 weeks followup 6 weeks  Elbow injections: Procedure Note  Pre-operative Diagnosis: left  tennis elbow  Post-operative Diagnosis: same  Indications: pain   Anesthesia: ethyl chloride   Procedure Details   Verbal consent was obtained and then a timeout was completed  Verbal consent was obtained for the procedure. The point of maximum tenderness was identified and marked. The skin prepped with alcohol.  Depo-Medrol 40 mg and lidocaine 1% 3 mls was injected.  Complications:  None; patient tolerated the procedure well.

## 2012-12-21 ENCOUNTER — Ambulatory Visit (INDEPENDENT_AMBULATORY_CARE_PROVIDER_SITE_OTHER): Payer: Medicare Other | Admitting: Orthopedic Surgery

## 2012-12-21 VITALS — BP 122/56 | Ht 62.0 in | Wt 151.0 lb

## 2012-12-21 DIAGNOSIS — M7710 Lateral epicondylitis, unspecified elbow: Secondary | ICD-10-CM

## 2012-12-21 DIAGNOSIS — M7712 Lateral epicondylitis, left elbow: Secondary | ICD-10-CM

## 2012-12-21 NOTE — Progress Notes (Signed)
Patient ID: Kendra Hancock, female   DOB: 11/25/1945, 67 y.o.   MRN: [id number] Chief Complaint  Patient presents with  . Follow-up    6 week recheck on left elbow.   BP 122/56  Ht 5\' 2"  (1.575 m)  Wt 151 lb (68.493 kg)  BMI 27.61 kg/m2  Followup for tennis elbow treated with exercise program and bracing and injection she reports no pain  She has full range of motion and no tenderness  Impression lateral tennis elbow left  Improved  Discharge

## 2012-12-21 NOTE — Patient Instructions (Addendum)
activities as tolerated  Use brace as needed

## 2013-09-17 ENCOUNTER — Other Ambulatory Visit (HOSPITAL_COMMUNITY): Payer: Self-pay | Admitting: Family Medicine

## 2013-09-17 DIAGNOSIS — Z139 Encounter for screening, unspecified: Secondary | ICD-10-CM

## 2013-09-24 ENCOUNTER — Ambulatory Visit (HOSPITAL_COMMUNITY)
Admission: RE | Admit: 2013-09-24 | Discharge: 2013-09-24 | Disposition: A | Discharge status: Home / Self Care | Payer: Medicare Other | Source: Ambulatory Visit | Attending: Family Medicine | Admitting: Family Medicine

## 2013-09-24 DIAGNOSIS — Z139 Encounter for screening, unspecified: Secondary | ICD-10-CM

## 2013-09-24 DIAGNOSIS — Z1231 Encounter for screening mammogram for malignant neoplasm of breast: Secondary | ICD-10-CM | POA: Insufficient documentation

## 2014-03-28 ENCOUNTER — Ambulatory Visit (INDEPENDENT_AMBULATORY_CARE_PROVIDER_SITE_OTHER): Payer: Medicare Other | Admitting: Orthopedic Surgery

## 2014-03-28 ENCOUNTER — Encounter: Payer: Self-pay | Admitting: Orthopedic Surgery

## 2014-03-28 DIAGNOSIS — M6530 Trigger finger, unspecified finger: Secondary | ICD-10-CM

## 2014-03-28 NOTE — Progress Notes (Signed)
Patient ID: Kendra Hancock, female   DOB: Apr 19, 1946, 68 y.o.   MRN: [id number] Chief Complaint  Patient presents with  . Hand Problem    Right index finger 3-4 months catching locking and triggering    HISTORY: 68 year old female new problem pain over the A1 pulley of the right index finger with catching locking and a.m. Stiffness.  No trauma. She's had previous trigger fingers treated by surgical release  Past medical history of hypertension thyroid disease  Previous hysterectomy thyroidectomy cholecystectomy and multiple trigger finger releases x4  Current medications include lisinopril 20 hydrochlorothiazide 25 of the proximal and 75 simvastatin 20 aspirin 81  Allergies amoxicillin causes rash family history of hypertension choked thyroid disease  Parents are deceased  Review of systems vision difficulties thyroid difficulties and symptoms prior to previous surgery otherwise normal   General the patient is well-developed and well-nourished grooming and hygiene are normal Oriented x3 Mood and affect normal Ambulation noncontributory but normal Inspection of the right index finger tenderness over the A1 pulley full range of motion with some catching sensation. Flexor tendons are intact skin is clean dry and intact capillary refill is normal good radial pulse normal sensation no lymphadenopathy in the arm  Cardiovascular exam is normal Sensory exam normal  Encounter Diagnosis  Name Primary?  . Trigger finger, acquired Yes    Procedure note trigger finger injection  Diagnosis trigger finger Postop diagnosis trigger finger Procedure injection of trigger finger We injected the right index finger Details of procedure: After verbal consent and timeout to confirm site the right index finger was injected with 1 cc of 40 mg of Depo-Medrol and 1 cc of 1% lidocaine  The procedure was tolerated well without complication

## 2014-03-28 NOTE — Patient Instructions (Signed)
Call in two weeks if your still having symtoms

## 2014-04-30 ENCOUNTER — Ambulatory Visit (INDEPENDENT_AMBULATORY_CARE_PROVIDER_SITE_OTHER): Payer: Medicare Other | Admitting: Orthopedic Surgery

## 2014-04-30 ENCOUNTER — Encounter: Payer: Self-pay | Admitting: Orthopedic Surgery

## 2014-04-30 VITALS — BP 137/68 | Ht 62.0 in | Wt 151.0 lb

## 2014-04-30 DIAGNOSIS — M6530 Trigger finger, unspecified finger: Secondary | ICD-10-CM

## 2014-04-30 NOTE — Progress Notes (Signed)
Patient ID: Kendra Hancock, female   DOB: 03-03-1946, 68 y.o.   MRN: [id number] Recheck right next finger for triggering. The patient reports no triggering some stiffness in the morning and at the end of the day with heavy use. She has no tenderness in the palm.  Examination reveals no tenderness no catching no locking.  It is okay for her to followup with [place] on an as-needed basis

## 2014-09-23 ENCOUNTER — Other Ambulatory Visit (HOSPITAL_COMMUNITY): Payer: Self-pay | Admitting: Family Medicine

## 2014-09-23 DIAGNOSIS — Z1231 Encounter for screening mammogram for malignant neoplasm of breast: Secondary | ICD-10-CM

## 2014-09-26 ENCOUNTER — Ambulatory Visit (HOSPITAL_COMMUNITY)
Admission: RE | Admit: 2014-09-26 | Discharge: 2014-09-26 | Disposition: A | Discharge status: Home / Self Care | Payer: Medicare Other | Source: Ambulatory Visit | Attending: Family Medicine | Admitting: Family Medicine

## 2014-09-26 DIAGNOSIS — Z1231 Encounter for screening mammogram for malignant neoplasm of breast: Secondary | ICD-10-CM

## 2015-03-06 ENCOUNTER — Ambulatory Visit: Payer: Self-pay | Admitting: Orthopedic Surgery

## 2015-09-08 ENCOUNTER — Other Ambulatory Visit (HOSPITAL_COMMUNITY): Payer: Self-pay | Admitting: Family Medicine

## 2015-09-08 DIAGNOSIS — Z1231 Encounter for screening mammogram for malignant neoplasm of breast: Secondary | ICD-10-CM

## 2015-09-29 ENCOUNTER — Ambulatory Visit (HOSPITAL_COMMUNITY)
Admission: RE | Admit: 2015-09-29 | Discharge: 2015-09-29 | Disposition: A | Discharge status: Home / Self Care | Payer: Medicare Other | Source: Ambulatory Visit | Attending: Family Medicine | Admitting: Family Medicine

## 2015-09-29 DIAGNOSIS — Z1231 Encounter for screening mammogram for malignant neoplasm of breast: Secondary | ICD-10-CM | POA: Insufficient documentation

## 2016-05-12 ENCOUNTER — Other Ambulatory Visit: Payer: Self-pay | Admitting: Family Medicine

## 2016-05-12 DIAGNOSIS — N6001 Solitary cyst of right breast: Secondary | ICD-10-CM

## 2016-05-18 ENCOUNTER — Ambulatory Visit
Admission: RE | Admit: 2016-05-18 | Discharge: 2016-05-18 | Disposition: A | Discharge status: Home / Self Care | Payer: Medicare Other | Source: Ambulatory Visit | Attending: Family Medicine | Admitting: Family Medicine

## 2016-05-18 DIAGNOSIS — N6001 Solitary cyst of right breast: Secondary | ICD-10-CM

## 2016-11-25 ENCOUNTER — Other Ambulatory Visit: Payer: Self-pay | Admitting: Family Medicine

## 2016-11-25 DIAGNOSIS — Z1231 Encounter for screening mammogram for malignant neoplasm of breast: Secondary | ICD-10-CM

## 2016-12-31 ENCOUNTER — Ambulatory Visit
Admission: RE | Admit: 2016-12-31 | Discharge: 2016-12-31 | Disposition: A | Discharge status: Home / Self Care | Payer: PPO | Source: Ambulatory Visit | Attending: Family Medicine | Admitting: Family Medicine

## 2016-12-31 DIAGNOSIS — Z1231 Encounter for screening mammogram for malignant neoplasm of breast: Secondary | ICD-10-CM

## 2017-01-19 DIAGNOSIS — Z6827 Body mass index (BMI) 27.0-27.9, adult: Secondary | ICD-10-CM | POA: Diagnosis not present

## 2017-01-19 DIAGNOSIS — Z1389 Encounter for screening for other disorder: Secondary | ICD-10-CM | POA: Diagnosis not present

## 2017-01-19 DIAGNOSIS — E039 Hypothyroidism, unspecified: Secondary | ICD-10-CM | POA: Diagnosis not present

## 2017-01-19 DIAGNOSIS — E663 Overweight: Secondary | ICD-10-CM | POA: Diagnosis not present

## 2017-01-19 DIAGNOSIS — E559 Vitamin D deficiency, unspecified: Secondary | ICD-10-CM | POA: Diagnosis not present

## 2017-01-19 DIAGNOSIS — E782 Mixed hyperlipidemia: Secondary | ICD-10-CM | POA: Diagnosis not present

## 2017-01-19 DIAGNOSIS — I10 Essential (primary) hypertension: Secondary | ICD-10-CM | POA: Diagnosis not present

## 2017-02-17 DIAGNOSIS — L570 Actinic keratosis: Secondary | ICD-10-CM | POA: Diagnosis not present

## 2017-02-17 DIAGNOSIS — X32XXXD Exposure to sunlight, subsequent encounter: Secondary | ICD-10-CM | POA: Diagnosis not present

## 2017-07-22 DIAGNOSIS — Z6828 Body mass index (BMI) 28.0-28.9, adult: Secondary | ICD-10-CM | POA: Diagnosis not present

## 2017-07-22 DIAGNOSIS — I10 Essential (primary) hypertension: Secondary | ICD-10-CM | POA: Diagnosis not present

## 2017-07-22 DIAGNOSIS — Z23 Encounter for immunization: Secondary | ICD-10-CM | POA: Diagnosis not present

## 2017-07-22 DIAGNOSIS — E782 Mixed hyperlipidemia: Secondary | ICD-10-CM | POA: Diagnosis not present

## 2017-07-22 DIAGNOSIS — E039 Hypothyroidism, unspecified: Secondary | ICD-10-CM | POA: Diagnosis not present

## 2017-07-22 DIAGNOSIS — E559 Vitamin D deficiency, unspecified: Secondary | ICD-10-CM | POA: Diagnosis not present

## 2017-07-26 DIAGNOSIS — H524 Presbyopia: Secondary | ICD-10-CM | POA: Diagnosis not present

## 2017-09-09 DIAGNOSIS — E039 Hypothyroidism, unspecified: Secondary | ICD-10-CM | POA: Diagnosis not present

## 2017-09-21 DIAGNOSIS — Z1389 Encounter for screening for other disorder: Secondary | ICD-10-CM | POA: Diagnosis not present

## 2017-09-21 DIAGNOSIS — Z6828 Body mass index (BMI) 28.0-28.9, adult: Secondary | ICD-10-CM | POA: Diagnosis not present

## 2017-09-21 DIAGNOSIS — Z0001 Encounter for general adult medical examination with abnormal findings: Secondary | ICD-10-CM | POA: Diagnosis not present

## 2017-09-21 DIAGNOSIS — E663 Overweight: Secondary | ICD-10-CM | POA: Diagnosis not present

## 2017-09-29 DIAGNOSIS — L570 Actinic keratosis: Secondary | ICD-10-CM | POA: Diagnosis not present

## 2017-09-29 DIAGNOSIS — X32XXXD Exposure to sunlight, subsequent encounter: Secondary | ICD-10-CM | POA: Diagnosis not present

## 2017-09-29 DIAGNOSIS — Z85828 Personal history of other malignant neoplasm of skin: Secondary | ICD-10-CM | POA: Diagnosis not present

## 2017-09-29 DIAGNOSIS — Z85820 Personal history of malignant melanoma of skin: Secondary | ICD-10-CM | POA: Diagnosis not present

## 2017-09-29 DIAGNOSIS — Z08 Encounter for follow-up examination after completed treatment for malignant neoplasm: Secondary | ICD-10-CM | POA: Diagnosis not present

## 2017-09-29 DIAGNOSIS — Z1283 Encounter for screening for malignant neoplasm of skin: Secondary | ICD-10-CM | POA: Diagnosis not present

## 2017-11-15 ENCOUNTER — Ambulatory Visit: Payer: PPO | Admitting: Orthopedic Surgery

## 2018-01-18 ENCOUNTER — Other Ambulatory Visit: Payer: Self-pay | Admitting: Family Medicine

## 2018-01-18 DIAGNOSIS — Z1231 Encounter for screening mammogram for malignant neoplasm of breast: Secondary | ICD-10-CM

## 2018-02-13 DIAGNOSIS — E039 Hypothyroidism, unspecified: Secondary | ICD-10-CM | POA: Diagnosis not present

## 2018-02-13 DIAGNOSIS — E559 Vitamin D deficiency, unspecified: Secondary | ICD-10-CM | POA: Diagnosis not present

## 2018-02-13 DIAGNOSIS — Z6828 Body mass index (BMI) 28.0-28.9, adult: Secondary | ICD-10-CM | POA: Diagnosis not present

## 2018-02-13 DIAGNOSIS — I10 Essential (primary) hypertension: Secondary | ICD-10-CM | POA: Diagnosis not present

## 2018-02-13 DIAGNOSIS — E782 Mixed hyperlipidemia: Secondary | ICD-10-CM | POA: Diagnosis not present

## 2018-02-13 DIAGNOSIS — Z1389 Encounter for screening for other disorder: Secondary | ICD-10-CM | POA: Diagnosis not present

## 2018-02-14 ENCOUNTER — Ambulatory Visit
Admission: RE | Admit: 2018-02-14 | Discharge: 2018-02-14 | Disposition: A | Discharge status: Home / Self Care | Payer: PPO | Source: Ambulatory Visit | Attending: Family Medicine | Admitting: Family Medicine

## 2018-02-14 DIAGNOSIS — Z1231 Encounter for screening mammogram for malignant neoplasm of breast: Secondary | ICD-10-CM

## 2018-03-09 DIAGNOSIS — X32XXXD Exposure to sunlight, subsequent encounter: Secondary | ICD-10-CM | POA: Diagnosis not present

## 2018-03-09 DIAGNOSIS — L570 Actinic keratosis: Secondary | ICD-10-CM | POA: Diagnosis not present

## 2018-04-13 DIAGNOSIS — D492 Neoplasm of unspecified behavior of bone, soft tissue, and skin: Secondary | ICD-10-CM | POA: Diagnosis not present

## 2018-04-13 DIAGNOSIS — D485 Neoplasm of uncertain behavior of skin: Secondary | ICD-10-CM | POA: Diagnosis not present

## 2018-05-01 ENCOUNTER — Ambulatory Visit (INDEPENDENT_AMBULATORY_CARE_PROVIDER_SITE_OTHER): Payer: PPO

## 2018-05-01 ENCOUNTER — Ambulatory Visit: Payer: PPO | Admitting: Orthopedic Surgery

## 2018-05-01 ENCOUNTER — Encounter

## 2018-05-01 ENCOUNTER — Encounter: Payer: Self-pay | Admitting: Orthopedic Surgery

## 2018-05-01 VITALS — BP 173/86 | HR 65 | Ht 62.0 in | Wt 153.0 lb

## 2018-05-01 DIAGNOSIS — M1712 Unilateral primary osteoarthritis, left knee: Secondary | ICD-10-CM | POA: Diagnosis not present

## 2018-05-01 DIAGNOSIS — M23322 Other meniscus derangements, posterior horn of medial meniscus, left knee: Secondary | ICD-10-CM

## 2018-05-01 DIAGNOSIS — M25562 Pain in left knee: Secondary | ICD-10-CM

## 2018-05-01 NOTE — Progress Notes (Addendum)
Patient ID: ERCIA CRISAFULLI, female   DOB: 10/11/1946, 72 y.o.   MRN: [id number]  Chief Complaint  Patient presents with  . Knee Pain    Left knee pain, no injury.    Assessment and Plan:  IMAGING: I have read and interpret the xrays as follows: See dictated report osteoarthritis medial compartment without secondary bone changes overall alignment 5 degrees valgus  MRI planned for left knee.  Patient will need surgery.  MRI to define anatomy prepare for surgery and confirm diagnosis  Procedure note left knee injection verbal consent was obtained to inject left knee joint  Timeout was completed to confirm the site of injection  The medications used were 40 mg of Depo-Medrol and 1% lidocaine 3 cc  Anesthesia was provided by ethyl chloride and the skin was prepped with alcohol.  After cleaning the skin with alcohol a 20-gauge needle was used to inject the left knee joint. There were no complications. A sterile bandage was applied.     Diagnosis and treatment:  Encounter Diagnoses  Name Primary?  . Acute pain of left knee   . Derangement of posterior horn of medial meniscus of left knee Yes  . Primary osteoarthritis of left knee    No orders of the defined types were placed in this encounter.   Chief Complaint  Patient presents with  . Knee Pain    Left knee pain, no injury.    72 year old female presents for evaluation of left knee pain  She is 71 years old she presents with a 6-week history of left knee pain.  She denies trauma.  She  describes severe medial knee pain which is a dull ache sharp at times grabs gives way associated with swelling and loss of extension as well as flexion.  She did  start ice and ibuprofen as well as topical arthritis creams with no relief   Review of Systems Review of Systems  Musculoskeletal: Negative for back pain.  Neurological: Negative for tingling.    Past Medical History:  Diagnosis Date  . Chest pain 03/2011   Exertional and  associated with dizziness and dyspnea  . Hyperlipidemia    Lipid profile: 162, 83, 58, 87 in 01/2011  . Hypertension    01/2011-normal CBC, CMetand TSH  . Thyroid disease 1980   s/p partial thyroidectomy    Past Surgical History:  Procedure Laterality Date  . ABDOMINAL HYSTERECTOMY  1986  . CHOLECYSTECTOMY  2003   Laparoscopic  . COLONOSCOPY  04/30/2009   normal rectum/ left sided diverticula/subtle patulous ICV  . THYROIDECTOMY  1980    Allergies  Allergen Reactions  . Amoxicillin Rash    Current Outpatient Medications  Medication Sig Dispense Refill  . ALPRAZolam (XANAX) 0.5 MG tablet Take 0.5 mg by mouth at bedtime as needed.    Marland Kitchen amLODipine (NORVASC) 5 MG tablet Take 5 mg by mouth daily.      Marland Kitchen aspirin 81 MG tablet Take 81 mg by mouth daily.      Marland Kitchen estropipate (OGEN) 1.5 MG tablet Take 1.5 mg by mouth daily.     . hydrochlorothiazide 25 MG tablet Take 25 mg by mouth daily.     Marland Kitchen levothyroxine (SYNTHROID, LEVOTHROID) 75 MCG tablet Take 75 mcg by mouth daily.     Marland Kitchen lisinopril (PRINIVIL,ZESTRIL) 20 MG tablet Take 20 mg by mouth daily.      Marland Kitchen NITROSTAT 0.4 MG SL tablet Place 0.4 mg under the tongue every 5 (five) minutes as needed.     Marland Kitchen  simvastatin (ZOCOR) 10 MG tablet Take 10 mg by mouth at bedtime.       No current facility-administered medications for this visit.      Physical Exam BP (!) 173/86   Pulse 65   Ht 5\' 2"  (1.575 m)   Wt 153 lb (69.4 kg)   BMI 27.98 kg/m     The patient is well developed well nourished and well groomed.   Orientation to person place and time is normal   Mood is pleasant. Affect normal  Ambulatory status is remarkable for a limp in the involved extremity yes left leg        Left knee examination:  Inspection: Tenderness is noted over the medial joint line   ROM: Is limited by pain with a maximum flexion arc of 110 with 15 degree loss of extension  Stability: Collateral ligaments are stable, the Lachman test and anterior and  posterior drawer tests are normal   We do palpate medial joint line tenderness and a positive McMurray's for medial meniscal tear   Motor exam: Grade 5 motor strength in the quadriceps musculature   Skin: Warm dry and intact over the right leg                       Neuro: normal sensation   Vascular: 2+ DP pulse with normal color and no edema.   Currently the right lower extremity and knee examination revealed no tenderness or swelling, full range of motion without contracture subluxation atrophy or tremor. Normal muscle tone no instability and the neurovascular status of the limb is normal.      Arther Abbott, MD 05/01/2018 2:07 PM

## 2018-05-01 NOTE — Patient Instructions (Signed)
Knee Injection, Care After Refer to this sheet in the next few weeks. These instructions provide you with information about caring for yourself after your procedure. Your health care provider may also give you more specific instructions. Your treatment has been planned according to current medical practices, but problems sometimes occur. Call your health care provider if you have any problems or questions after your procedure. What can I expect after the procedure? After the procedure, it is common to have:  Soreness.  Warmth.  Swelling.  You may have more pain, swelling, and warmth than you did before the injection. This reaction may last for about one day. Follow these instructions at home: Bathing  If you were given a bandage (dressing), keep it dry until your health care provider says it can be removed. Ask your health care provider when you can start showering or taking a bath. Managing pain, stiffness, and swelling  If directed, apply ice to the injection area: ? Put ice in a plastic bag. ? Place a towel between your skin and the bag. ? Leave the ice on for 20 minutes, 2-3 times per day.  Do not apply heat to your knee.  Raise the injection area above the level of your heart while you are sitting or lying down. Activity  Avoid strenuous activities for as long as directed by your health care provider. Ask your health care provider when you can return to your normal activities. General instructions  Take medicines only as directed by your health care provider.  Do not take aspirin or other over-the-counter medicines unless your health care provider says you can.  Check your injection site every day for signs of infection. Watch for: ? Redness, swelling, or pain. ? Fluid, blood, or pus.  Follow your health care provider's instructions about dressing changes and removal. Contact a health care provider if:  You have symptoms at your injection site that last longer than two  days after your procedure.  You have redness, swelling, or pain in your injection area.  You have fluid, blood, or pus coming from your injection site.  You have warmth in your injection area.  You have a fever.  Your pain is not controlled with medicine. Get help right away if:  Your knee turns very red.  Your knee becomes very swollen.  Your knee pain is severe. This information is not intended to replace advice given to you by your health care provider. Make sure you discuss any questions you have with your health care provider. Document Released: 11/01/2014 Document Revised: 06/16/2016 Document Reviewed: 08/21/2014 Elsevier Interactive Patient Education  2018 [place].  Meniscus Tear A meniscus tear is a knee injury in which a piece of the meniscus is torn. The meniscus is a thick, rubbery, wedge-shaped cartilage in the knee. Two menisci are located in each knee. They sit between the upper bone (femur) and lower bone (tibia) that make up the knee joint. Each meniscus acts as a shock absorber for the knee. A torn meniscus is one of the most common types of knee injuries. This injury can range from mild to severe. Surgery may be needed for a severe tear. What are the causes? This injury may be caused by any squatting, twisting, or pivoting movement. Sports-related injuries are the most common cause. These often occur from:  Running and stopping suddenly.  Changing direction.  Being tackled or knocked off your feet.  As people get older, their meniscus gets thinner and weaker. In these people,  tears can happen more easily, such as from climbing stairs. What increases the risk? This injury is more likely to happen to:  People who play contact sports.  Males.  People who are 38?74 years of age.  What are the signs or symptoms? Symptoms of this injury include:  Knee pain, especially at the side of the knee joint. You may feel pain when the injury occurs, or you may  only hear a pop and feel pain later.  A feeling that your knee is clicking, catching, locking, or giving way.  Not being able to fully bend or extend your knee.  Bruising or swelling in your knee.  How is this diagnosed? This injury may be diagnosed based on your symptoms and a physical exam. The physical exam may include:  Moving your knee in different ways.  Feeling for tenderness.  Listening for a clicking sound.  Checking if your knee locks or catches.  You may also have tests, such as:  X-rays.  MRI.  A procedure to look inside your knee with a narrow surgical telescope (arthroscopy).  You may be referred to a knee specialist (orthopedic surgeon). How is this treated? Treatment for this injury depends on the severity of the tear. Treatment for a mild tear may include:  Rest.  Medicine to reduce pain and swelling. This is usually a nonsteroidal anti-inflammatory drug (NSAID).  A knee brace or an elastic sleeve or wrap.  Using crutches or a walker to keep weight off your knee and to help you walk.  Exercises to strengthen your knee (physical therapy).  You may need surgery if you have a severe tear or if other treatments are not working. Follow these instructions at home: Managing pain and swelling  Take over-the-counter and prescription medicines only as told by your health care provider.  If directed, apply ice to the injured area: ? Put ice in a plastic bag. ? Place a towel between your skin and the bag. ? Leave the ice on for 20 minutes, 2-3 times per day.  Raise (elevate) the injured area above the level of your heart while you are sitting or lying down. Activity  Do not use the injured limb to support your body weight until your health care provider says that you can. Use crutches or a walker as told by your health care provider.  Return to your normal activities as told by your health care provider. Ask your health care provider what activities are  safe for you.  Perform range-of-motion exercises only as told by your health care provider.  Begin doing exercises to strengthen your knee and leg muscles only as told by your health care provider. After you recover, your health care provider may recommend these exercises to help prevent another injury. General instructions  Use a knee brace or elastic wrap as told by your health care provider.  Keep all follow-up visits as told by your health care provider. This is important. Contact a health care provider if:  You have a fever.  Your knee becomes red, tender, or swollen.  Your pain medicine is not helping.  Your symptoms get worse or do not improve after 2 weeks of home care. This information is not intended to replace advice given to you by your health care provider. Make sure you discuss any questions you have with your health care provider. Document Released: 01/01/2003 Document Revised: 03/18/2016 Document Reviewed: 02/03/2015 Elsevier Interactive Patient Education  [employer].

## 2018-05-04 ENCOUNTER — Telehealth: Payer: Self-pay | Admitting: Orthopedic Surgery

## 2018-05-04 ENCOUNTER — Telehealth: Payer: Self-pay | Admitting: Radiology

## 2018-05-04 DIAGNOSIS — M25562 Pain in left knee: Principal | ICD-10-CM

## 2018-05-04 DIAGNOSIS — G8929 Other chronic pain: Secondary | ICD-10-CM

## 2018-05-04 NOTE — Telephone Encounter (Signed)
MRI was approved by [employer], I have scheduled study, called patient to advise and made follow up appointment

## 2018-05-04 NOTE — Telephone Encounter (Signed)
Patient states she was checking about the MRI that was suppose to be scheduled for her. Stated she hadn't heard anything from anyone about an appointment.  Please call and advise

## 2018-05-04 NOTE — Telephone Encounter (Signed)
I have submitted a prior auth request via fax to [employer], will schedule when approved. I called patient to advise.

## 2018-05-09 ENCOUNTER — Ambulatory Visit (HOSPITAL_COMMUNITY)
Admission: RE | Admit: 2018-05-09 | Discharge: 2018-05-09 | Disposition: A | Discharge status: Home / Self Care | Payer: PPO | Source: Ambulatory Visit | Attending: Orthopedic Surgery | Admitting: Orthopedic Surgery

## 2018-05-09 DIAGNOSIS — S83232A Complex tear of medial meniscus, current injury, left knee, initial encounter: Secondary | ICD-10-CM | POA: Diagnosis not present

## 2018-05-09 DIAGNOSIS — S72432A Displaced fracture of medial condyle of left femur, initial encounter for closed fracture: Secondary | ICD-10-CM | POA: Insufficient documentation

## 2018-05-09 DIAGNOSIS — M25562 Pain in left knee: Secondary | ICD-10-CM | POA: Diagnosis not present

## 2018-05-09 DIAGNOSIS — M7989 Other specified soft tissue disorders: Secondary | ICD-10-CM | POA: Diagnosis not present

## 2018-05-09 DIAGNOSIS — G8929 Other chronic pain: Secondary | ICD-10-CM | POA: Insufficient documentation

## 2018-05-10 ENCOUNTER — Ambulatory Visit: Payer: PPO | Admitting: Orthopedic Surgery

## 2018-05-10 ENCOUNTER — Encounter: Payer: Self-pay | Admitting: Orthopedic Surgery

## 2018-05-10 VITALS — BP 210/70 | HR 59 | Ht 62.0 in | Wt 153.0 lb

## 2018-05-10 DIAGNOSIS — M84352A Stress fracture, left femur, initial encounter for fracture: Secondary | ICD-10-CM | POA: Diagnosis not present

## 2018-05-10 DIAGNOSIS — M23322 Other meniscus derangements, posterior horn of medial meniscus, left knee: Secondary | ICD-10-CM | POA: Diagnosis not present

## 2018-05-10 DIAGNOSIS — M1712 Unilateral primary osteoarthritis, left knee: Secondary | ICD-10-CM | POA: Diagnosis not present

## 2018-05-10 NOTE — Progress Notes (Signed)
Chief Complaint  Patient presents with  . Knee Pain    left  . Results    review MRI left knee   72 years old presents after MRI follow-up presented with acute knee pain  She received cortisone injection and that seemed to give her some pain relief although she still having discomfort and unable to walk for long distances  MRI report  Review of Systems - Musculoskeletal ROS: positive for - joint pain, joint stiffness and joint swelling    IMPRESSION: 1. Complex tear of the posterior horn medial meniscus with a probable complete radial tear of the posteromedial corner. 2. Focal insufficiency fracture of central portion of the medial femoral condyle, best seen on image 17 of series 6. Adjacent bone and soft tissue edema.     Electronically Signed   By: Lorriane Shire M.D.   On: 05/10/2018 08:27   MRI independent reading multiple images of the left knee  On the medial femoral condyle and medial tibial plateau subchondral bone edema consistent with probable stress or insufficiency fracture.  She also has a torn medial meniscus.  There is also degenerative changes throughout the knee  Impression insufficiency fracture right femur  Torn medial meniscus  Osteoarthritis.  Encounter Diagnoses  Name Primary?  . Stress fracture of left femur, initial encounter Yes  . Primary osteoarthritis of left knee   . Derangement of posterior horn of medial meniscus of left knee       Eco hinge brace for 6 weeks  Decided if she is not better after treating the stress fracture the meniscus may be the primary cause of pain and then we can proceed with arthroscopic surgery

## 2018-06-20 DIAGNOSIS — M84352D Stress fracture, left femur, subsequent encounter for fracture with routine healing: Secondary | ICD-10-CM | POA: Insufficient documentation

## 2018-06-21 ENCOUNTER — Ambulatory Visit: Payer: PPO | Admitting: Orthopedic Surgery

## 2018-06-21 ENCOUNTER — Encounter: Payer: Self-pay | Admitting: Orthopedic Surgery

## 2018-06-21 ENCOUNTER — Other Ambulatory Visit: Payer: Self-pay | Admitting: Orthopedic Surgery

## 2018-06-21 VITALS — BP 176/71 | HR 59 | Ht 62.0 in | Wt 153.0 lb

## 2018-06-21 DIAGNOSIS — M23322 Other meniscus derangements, posterior horn of medial meniscus, left knee: Secondary | ICD-10-CM

## 2018-06-21 DIAGNOSIS — M84352D Stress fracture, left femur, subsequent encounter for fracture with routine healing: Secondary | ICD-10-CM

## 2018-06-21 NOTE — Progress Notes (Signed)
Chief Complaint  Patient presents with  . Knee Pain    left    72 year old female been treated for insufficiency fracture and complex medial meniscal tear we gave her a knee brace she did get some improvement but she is having trouble with her activities of daily living standing walking and walking in the garden  Her MRI shows complex tear of the posterior horn the medial meniscus focal insufficiency fracture central portion medial femoral condyle  She says that she does not want to live with her knee the way it is so we are proceeding with arthroscopy  Review of systems no change from the previous view of systems Review of Systems  Musculoskeletal: Negative for back pain.  Neurological: Negative for tingling.   BP (!) 176/71   Pulse (!) 59   Ht 5\' 2"  (1.575 m)   Wt 153 lb (69.4 kg)   BMI 27.98 kg/m   Left knee full range of motion Tenderness on the medial joint line and medial tibial plateau and femur No effusion Knee is stable Strength and muscle tone are normal Pulse and perfusion normal sensation intact skin normal   Plan for arthroscopy left knee partial medial meniscectomy may need to continue bracing for the fracture   Encounter Diagnoses  Name Primary?  . Stress fracture, left femur, subsequent encounter for fracture with routine healing   . Derangement of posterior horn of medial meniscus of left knee Yes   Arthroscopy left knee partial medial meniscectomy   The procedure has been fully reviewed with the patient; The risks and benefits of surgery have been discussed and explained and understood. Alternative treatment has also been reviewed, questions were encouraged and answered. The postoperative plan is also been reviewed.

## 2018-06-21 NOTE — Patient Instructions (Signed)
Meniscus Injury, Arthroscopy Arthroscopy is a surgical procedure that involves the use of a small scope that has a camera and surgical instruments on the end (arthroscope). An arthroscope can be used to repair your meniscus injury.  LET [facility] CARE PROVIDER KNOW ABOUT:  Any allergies you have.  All medicines you are taking, including vitamins, herbs, eyedrops, creams, and over-the-counter medicines.  Any recent colds or infections you have had or currently have.  Previous problems you or members of your family have had with the use of anesthetics.  Any blood disorders or blood clotting problems you have.  Previous surgeries you have had.  Medical conditions you have. RISKS AND COMPLICATIONS Generally, this is a safe procedure. However, as with any procedure, problems can occur. Possible problems include:  Damage to nerves or blood vessels.  Excess bleeding.  Blood clots.  Infection. BEFORE THE PROCEDURE  Do not eat or drink for 6-8 hours before the procedure.  Take medicines as directed by your surgeon. Ask your surgeon about changing or stopping your regular medicines.  You may have lab tests the morning of surgery. PROCEDURE  You will be given one of the following:   A medicine that numbs the area (local anesthesia).  A medicine that makes you go to sleep (general anesthesia).  A medicine injected into your spine that numbs your body below the waist (spinal anesthesia). Most often, several small cuts (incisions) are made in the knee. The arthroscope and instruments go into the incisions to repair the damage. The torn portion of the meniscus is removed.   AFTER THE PROCEDURE  You will be taken to the recovery area where your progress will be monitored. When you are awake, stable, and taking fluids without complications, you will be allowed to go home. This is usually the same day. A torn or stretched ligament (ligament sprain) may take 6-8 weeks to heal.   It  takes about the 4-6 WEEKS if your surgeon removed a torn meniscus.  A repaired meniscus may require 6-12 weeks of recovery time.  A torn ligament needing reconstructive surgery may take 6-12 months to heal fully.   This information is not intended to replace advice given to you by your health care provider. Make sure you discuss any questions you have with your health care provider. You have decided to proceed with operative arthroscopy of the knee. You have decided not to continue with nonoperative measures such as but not limited to oral medication, weight loss, activity modification, physical therapy, bracing, or injection.  We will perform operative arthroscopy of the knee. Some of the risks associated with arthroscopic surgery of the knee include but are not limited to Bleeding Infection Swelling Stiffness Blood clot Pain  If you're not comfortable with these risks and would like to continue with nonoperative treatment please let Dr. Aline Brochure know prior to your surgery.   Document Released: 10/08/2000 Document Revised: 10/16/2013 Document Reviewed: 03/09/2013 Elsevier Interactive Patient Education 2016 [place] have decided to proceed with operative arthroscopy of the knee. You have decided not to continue with nonoperative measures such as but not limited to oral medication, weight loss, activity modification, physical therapy, bracing, or injection.  In compliance with recent [place] law in federal regulation regarding opioid use and abuse and addiction, we will taper (stop) opioid medication after 2 weeks.  We will perform operative arthroscopy of the knee. Some of the risks associated with arthroscopic surgery of the knee include but are not limited to  Bleeding Infection Swelling Stiffness Blood clot Pain  If you're not comfortable with these risks and would like to continue with nonoperative treatment please let Dr. Aline Brochure know prior to your surgery.

## 2018-06-22 NOTE — Patient Instructions (Signed)
Kendra Hancock  06/22/2018     @PREFPERIOPPHARMACY @   Your procedure is scheduled on  06/29/2018.  Report to [facility] at  1030  A.M.  Call this number if you have problems the morning of surgery:  [phone]   Remember:  Do not eat or drink after midnight.  You may drink clear liquids until 12 midnight 06/28/2018 .  Clear liquids allowed are:                    Water, Juice (non-citric and without pulp), Carbonated beverages, Clear Tea, Black Coffee only, Plain Jell-O only, Gatorade and Plain Popsicles only    Take these medicines the morning of surgery with A SIP OF WATER levothyroxine, lisinopril.    Do not wear jewelry, make-up or nail polish.  Do not wear lotions, powders, or perfumes, or deodorant.  Do not shave 48 hours prior to surgery.  Men may shave face and neck.  Do not bring valuables to the hospital.  [facility] is not responsible for any belongings or valuables.  Contacts, dentures or bridgework may not be worn into surgery.  Leave your suitcase in the car.  After surgery it may be brought to your room.  For patients admitted to the hospital, discharge time will be determined by your treatment team.  Patients discharged the day of surgery will not be allowed to drive home.   Name and phone number of your driver:   family Special instructions:  None  Please read over the following fact sheets that you were given. Anesthesia Post-op Instructions and Care and Recovery After Surgery      Knee Ligament Injury, Arthroscopy Arthroscopy is a surgical technique in which your health care provider examines your knee through a small, pencil-sized telescope (arthroscope). Often, repairs to injured ligaments can be done with instruments in the arthroscope. Arthroscopy is less invasive than open-knee surgery. Tell a health care provider about:  Any allergies you have.  All medicines you are taking, including vitamins, herbs, eye drops, creams, and  over-the-counter medicines.  Any problems you or family members have had with anesthetic medicines.  Any blood disorders you have.  Any surgeries you have had.  Any medical conditions you have. What are the risks? Generally, this is a safe procedure. However, as with any procedure, problems can occur. Possible problems include:  Infection.  Bleeding.  Stiffness.  What happens before the procedure?  Ask your health care provider about changing or stopping any regular medicines. Avoid taking aspirin or blood thinners as directed by your health care provider.  Do not eat or drink anything after midnight the night before surgery.  If you smoke, do not smoke for at least 2 weeks before your surgery.  Do not drink alcohol starting the day before your surgery.  Let your health care provider know if you develop a cold or any infection before your surgery.  Arrange for someone to drive you home after the surgery or after your hospital stay. Also arrange for someone to help you with activities during recovery. What happens during the procedure?  Small monitors will be put on your body. They are used to check your heart, blood pressure, and oxygen levels.  An IV access tube will be put into one of your veins. Medicine will be able to flow directly into your body through this IV tube.  You might be given a medicine to help you  relax (sedative).  You will be given a medicine that makes you go to sleep (general anesthetic), and a breathing tube will be placed into your lungs during the procedure.  Several small incisions are made in your knee. Saline fluid is placed into one of the incisions to expand the knee and clear away any blood in the knee.  Your health care provider will insert the arthroscope to examine the injured knee.  During arthroscopy, your health care provider may find a partial or complete tear in a ligament.  Tools can be inserted through the other incisions to  repair the injured ligaments.  The incisions are then closed with absorbable stitches and covered with dressings. What happens after the procedure?  You will be taken to the recovery area where you will be monitored.  When you are awake, stable, and taking fluids without problems, you will be allowed to go home. This information is not intended to replace advice given to you by your health care provider. Make sure you discuss any questions you have with your health care provider. Document Released: 10/08/2000 Document Revised: 03/18/2016 Document Reviewed: 05/23/2013 Elsevier Interactive Patient Education  2017 [place].  Arthroscopic Knee Ligament Repair, Care After This sheet gives you information about how to care for yourself after your procedure. Your health care provider may also give you more specific instructions. If you have problems or questions, contact your health care provider. What can I expect after the procedure? After the procedure, it is common to have:  Pain in your knee.  Bruising and swelling on your knee, calf, and ankle for 3-4 days.  Fatigue.  Follow these instructions at home: If you have a brace or immobilizer:  Wear the brace or immobilizer as told by your health care provider. Remove it only as told by your health care provider.  Loosen the splint or immobilizer if your toes tingle, become numb, or turn cold and blue.  Keep the brace or immobilizer clean. Bathing  Do not take baths, swim, or use a hot tub until your health care provider approves. Ask your health care provider if you can take showers.  Keep your bandage (dressing) dry until your health care provider says that it can be removed. Cover it and your brace or immobilizer with a watertight covering when you take a shower. Incision care  Follow instructions from your health care provider about how to take care of your incision. Make sure you: ? Wash your hands with soap and water before  you change your bandage (dressing). If soap and water are not available, use hand sanitizer. ? Change your dressing as told by your health care provider. ? Leave stitches (sutures), skin glue, or adhesive strips in place. These skin closures may need to stay in place for 2 weeks or longer. If adhesive strip edges start to loosen and curl up, you may trim the loose edges. Do not remove adhesive strips completely unless your health care provider tells you to do that.  Check your incision area every day for signs of infection. Check for: ? More redness, swelling, or pain. ? More fluid or blood. ? Warmth. ? Pus or a bad smell. Managing pain, stiffness, and swelling  If directed, put ice on the affected area. ? If you have a removable brace or immobilizer, remove it as told by your health care provider. ? Put ice in a plastic bag. ? Place a towel between your skin and the bag or between your brace  or immobilizer and the bag. ? Leave the ice on for 20 minutes, 2-3 times a day.  Move your toes often to avoid stiffness and to lessen swelling.  Raise (elevate) the injured area above the level of your heart while you are sitting or lying down. Driving  Do not drive until your health care provider approves. If you have a brace or immobilizer on your leg, ask your health care provider when it is safe for you to drive.  Do not drive or use heavy machinery while taking prescription pain medicine. Activity  Rest as directed. Ask your health care provider what activities are safe for you.  Do physical therapy exercises as told by your health care provider. Physical therapy will help you regain strength and motion in your knee.  Follow instructions from your health care provider about: ? When you may start motion exercises. ? When you may start riding a stationary bike and doing other low-impact activities. ? When you may start to jog and do other high-impact activities. Safety  Do not use the  injured limb to support your body weight until your health care provider says that you can. Use crutches as told by your health care provider. General instructions  Do not use any products that contain nicotine or tobacco, such as cigarettes and e-cigarettes. These can delay bone healing. If you need help quitting, ask your health care provider.  To prevent or treat constipation while you are taking prescription pain medicine, your health care provider may recommend that you: ? Drink enough fluid to keep your urine clear or pale yellow. ? Take over-the-counter or prescription medicines. ? Eat foods that are high in fiber, such as fresh fruits and vegetables, whole grains, and beans. ? Limit foods that are high in fat and processed sugars, such as fried and sweet foods.  Take over-the-counter and prescription medicines only as told by your health care provider.  Keep all follow-up visits as told by your health care provider. This is important. Contact a health care provider if:  You have more redness, swelling, or pain around an incision.  You have more fluid or blood coming from an incision.  Your incision feels warm to the touch.  You have a fever.  You have pain or swelling in your knee, and it gets worse.  You have pain that does not get better with medicine. Get help right away if:  You have trouble breathing.  You have pus or a bad smell coming from an incision.  You have numbness and tingling near the knee joint. Summary  After the procedure, it is common to have knee pain with bruising and swelling on your knee, calf, and ankle.  Icing your knee and raising your leg above the level of your heart will help control the pain and the swelling.  Do physical therapy exercises as told by your health care provider. Physical therapy will help you regain strength and motion in your knee. This information is not intended to replace advice given to you by your health care  provider. Make sure you discuss any questions you have with your health care provider. Document Released: 08/01/2013 Document Revised: 10/05/2016 Document Reviewed: 10/05/2016 Elsevier Interactive Patient Education  2017 [place] Anesthesia, Adult General anesthesia is the use of medicines to make a person "go to sleep" (be unconscious) for a medical procedure. General anesthesia is often recommended when a procedure:  Is long.  Requires you to be still or in  an unusual position.  Is major and can cause you to lose blood.  Is impossible to do without general anesthesia.  The medicines used for general anesthesia are called general anesthetics. In addition to making you sleep, the medicines:  Prevent pain.  Control your blood pressure.  Relax your muscles.  Tell a health care provider about:  Any allergies you have.  All medicines you are taking, including vitamins, herbs, eye drops, creams, and over-the-counter medicines.  Any problems you or family members have had with anesthetic medicines.  Types of anesthetics you have had in the past.  Any bleeding disorders you have.  Any surgeries you have had.  Any medical conditions you have.  Any history of heart or lung conditions, such as heart failure, sleep apnea, or chronic obstructive pulmonary disease (COPD).  Whether you are pregnant or may be pregnant.  Whether you use tobacco, alcohol, marijuana, or street drugs.  Any history of Armed forces logistics/support/administrative officer.  Any history of depression or anxiety. What are the risks? Generally, this is a safe procedure. However, problems may occur, including:  Allergic reaction to anesthetics.  Lung and heart problems.  Inhaling food or liquids from your stomach into your lungs (aspiration).  Injury to nerves.  Waking up during your procedure and being unable to move (rare).  Extreme agitation or a state of mental confusion (delirium) when you wake up from the  anesthetic.  Air in the bloodstream, which can lead to stroke.  These problems are more likely to develop if you are having a major surgery or if you have an advanced medical condition. You can prevent some of these complications by answering all of your health care provider's questions thoroughly and by following all pre-procedure instructions. General anesthesia can cause side effects, including:  Nausea or vomiting  A sore throat from the breathing tube.  Feeling cold or shivery.  Feeling tired, washed out, or achy.  Sleepiness or drowsiness.  Confusion or agitation.  What happens before the procedure? Staying hydrated Follow instructions from your health care provider about hydration, which may include:  Up to 2 hours before the procedure - you may continue to drink clear liquids, such as water, clear fruit juice, black coffee, and plain tea.  Eating and drinking restrictions Follow instructions from your health care provider about eating and drinking, which may include:  8 hours before the procedure - stop eating heavy meals or foods such as meat, fried foods, or fatty foods.  6 hours before the procedure - stop eating light meals or foods, such as toast or cereal.  6 hours before the procedure - stop drinking milk or drinks that contain milk.  2 hours before the procedure - stop drinking clear liquids.  Medicines  Ask your health care provider about: ? Changing or stopping your regular medicines. This is especially important if you are taking diabetes medicines or blood thinners. ? Taking medicines such as aspirin and ibuprofen. These medicines can thin your blood. Do not take these medicines before your procedure if your health care provider instructs you not to. ? Taking new dietary supplements or medicines. Do not take these during the week before your procedure unless your health care provider approves them.  If you are told to take a medicine or to continue  taking a medicine on the day of the procedure, take the medicine with sips of water. General instructions   Ask if you will be going home the same day, the following day, or after  a longer hospital stay. ? Plan to have someone take you home. ? Plan to have someone stay with you for the first 24 hours after you leave the hospital or clinic.  For 3-6 weeks before the procedure, try not to use any tobacco products, such as cigarettes, chewing tobacco, and e-cigarettes.  You may brush your teeth on the morning of the procedure, but make sure to spit out the toothpaste. What happens during the procedure?  You will be given anesthetics through a mask and through an IV tube in one of your veins.  You may receive medicine to help you relax (sedative).  As soon as you are asleep, a breathing tube may be used to help you breathe.  An anesthesia specialist will stay with you throughout the procedure. He or she will help keep you comfortable and safe by continuing to give you medicines and adjusting the amount of medicine that you get. He or she will also watch your blood pressure, pulse, and oxygen levels to make sure that the anesthetics do not cause any problems.  If a breathing tube was used to help you breathe, it will be removed before you wake up. The procedure may vary among health care providers and hospitals. What happens after the procedure?  You will wake up, often slowly, after the procedure is complete, usually in a recovery area.  Your blood pressure, heart rate, breathing rate, and blood oxygen level will be monitored until the medicines you were given have worn off.  You may be given medicine to help you calm down if you feel anxious or agitated.  If you will be going home the same day, your health care provider may check to make sure you can stand, drink, and urinate.  Your health care providers will treat your pain and side effects before you go home.  Do not drive for 24  hours if you received a sedative.  You may: ? Feel nauseous and vomit. ? Have a sore throat. ? Have mental slowness. ? Feel cold or shivery. ? Feel sleepy. ? Feel tired. ? Feel sore or achy, even in parts of your body where you did not have surgery. This information is not intended to replace advice given to you by your health care provider. Make sure you discuss any questions you have with your health care provider. Document Released: 01/18/2008 Document Revised: 03/23/2016 Document Reviewed: 09/25/2015 Elsevier Interactive Patient Education  2018 [place] Anesthesia, Adult, Care After These instructions provide you with information about caring for yourself after your procedure. Your health care provider may also give you more specific instructions. Your treatment has been planned according to current medical practices, but problems sometimes occur. Call your health care provider if you have any problems or questions after your procedure. What can I expect after the procedure? After the procedure, it is common to have:  Vomiting.  A sore throat.  Mental slowness.  It is common to feel:  Nauseous.  Cold or shivery.  Sleepy.  Tired.  Sore or achy, even in parts of your body where you did not have surgery.  Follow these instructions at home: For at least 24 hours after the procedure:  Do not: ? Participate in activities where you could fall or become injured. ? Drive. ? Use heavy machinery. ? Drink alcohol. ? Take sleeping pills or medicines that cause drowsiness. ? Make important decisions or sign legal documents. ? Take care of children on your own.  Rest. Eating  and drinking  If you vomit, drink water, juice, or soup when you can drink without vomiting.  Drink enough fluid to keep your urine clear or pale yellow.  Make sure you have little or no nausea before eating solid foods.  Follow the diet recommended by your health care  provider. General instructions  Have a responsible adult stay with you until you are awake and alert.  Return to your normal activities as told by your health care provider. Ask your health care provider what activities are safe for you.  Take over-the-counter and prescription medicines only as told by your health care provider.  If you smoke, do not smoke without supervision.  Keep all follow-up visits as told by your health care provider. This is important. Contact a health care provider if:  You continue to have nausea or vomiting at home, and medicines are not helpful.  You cannot drink fluids or start eating again.  You cannot urinate after 8-12 hours.  You develop a skin rash.  You have fever.  You have increasing redness at the site of your procedure. Get help right away if:  You have difficulty breathing.  You have chest pain.  You have unexpected bleeding.  You feel that you are having a life-threatening or urgent problem. This information is not intended to replace advice given to you by your health care provider. Make sure you discuss any questions you have with your health care provider. Document Released: 01/17/2001 Document Revised: 03/15/2016 Document Reviewed: 09/25/2015 Elsevier Interactive Patient Education  [employer].

## 2018-06-27 ENCOUNTER — Other Ambulatory Visit: Payer: Self-pay

## 2018-06-27 ENCOUNTER — Encounter (HOSPITAL_COMMUNITY)
Admission: RE | Admit: 2018-06-27 | Discharge: 2018-06-27 | Disposition: A | Discharge status: Home / Self Care | Payer: PPO | Source: Ambulatory Visit | Attending: Orthopedic Surgery | Admitting: Orthopedic Surgery

## 2018-06-27 ENCOUNTER — Encounter (HOSPITAL_COMMUNITY): Payer: Self-pay

## 2018-06-27 DIAGNOSIS — Z01812 Encounter for preprocedural laboratory examination: Secondary | ICD-10-CM

## 2018-06-27 DIAGNOSIS — R030 Elevated blood-pressure reading, without diagnosis of hypertension: Secondary | ICD-10-CM | POA: Diagnosis not present

## 2018-06-27 DIAGNOSIS — Z01810 Encounter for preprocedural cardiovascular examination: Secondary | ICD-10-CM | POA: Insufficient documentation

## 2018-06-27 LAB — BASIC METABOLIC PANEL
Anion gap: 8 (ref 5–15)
BUN: 12 mg/dL (ref 8–23)
CO2: 29 mmol/L (ref 22–32)
Calcium: 9 mg/dL (ref 8.9–10.3)
Chloride: 97 mmol/L — ABNORMAL LOW (ref 98–111)
Creatinine, Ser: 0.66 mg/dL (ref 0.44–1.00)
GFR calc Af Amer: >60 mL/min (ref >60)
GFR calc non Af Amer: >60 mL/min (ref >60)
Glucose, Bld: 99 mg/dL (ref 70–99)
Potassium: 3.5 mmol/L (ref 3.5–5.1)
Sodium: 134 mmol/L — ABNORMAL LOW (ref 135–145)

## 2018-06-27 LAB — CBC WITH DIFFERENTIAL/PLATELET
Basophils Absolute: 0.1 10*3/uL (ref 0.0–0.1)
Basophils Relative: 1 %
Eosinophils Absolute: 0.2 10*3/uL (ref 0.0–0.7)
Eosinophils Relative: 2 %
HCT: 38.4 % (ref 36.0–46.0)
Hemoglobin: 12.9 g/dL (ref 12.0–15.0)
Lymphocytes Relative: 33 %
Lymphs Abs: 3.1 10*3/uL (ref 0.7–4.0)
MCH: 30.3 pg (ref 26.0–34.0)
MCHC: 33.6 g/dL (ref 30.0–36.0)
MCV: 90.1 fL (ref 78.0–100.0)
Monocytes Absolute: 0.8 10*3/uL (ref 0.1–1.0)
Monocytes Relative: 9 %
Neutro Abs: 5 10*3/uL (ref 1.7–7.7)
Neutrophils Relative %: 55 %
Platelets: 453 10*3/uL — ABNORMAL HIGH (ref 150–400)
RBC: 4.26 MIL/uL (ref 3.87–5.11)
RDW: 13.5 % (ref 11.5–15.5)
WBC: 9.2 10*3/uL (ref 4.0–10.5)

## 2018-06-27 NOTE — H&P (Signed)
Chief Complaint  Patient presents with  . Knee Pain    left    72 year old female been treated for insufficiency fracture and complex medial meniscal tear we gave her a knee brace she did get some improvement but she is having trouble with her activities of daily living standing walking and walking in the garden  Her MRI shows complex tear of the posterior horn the medial meniscus focal insufficiency fracture central portion medial femoral condyle  She says that she does not want to live with her knee the way it is so we are proceeding with arthroscopy  Review of systems no change from the previous view of systems Review of Systems  Musculoskeletal: Negative for back pain.  Neurological: Negative for tingling.   Past Medical History:  Diagnosis Date  . Chest pain 03/2011   Exertional and associated with dizziness and dyspnea  . Hyperlipidemia    Lipid profile: 162, 83, 58, 87 in 01/2011  . Hypertension    01/2011-normal CBC, CMetand TSH  . Thyroid disease 1980   s/p partial thyroidectomy    Past Surgical History:  Procedure Laterality Date  . ABDOMINAL HYSTERECTOMY  1986  . CHOLECYSTECTOMY  2003   Laparoscopic  . COLONOSCOPY  04/30/2009   normal rectum/ left sided diverticula/subtle patulous ICV  . THYROIDECTOMY  1980    Family History  Problem Relation Age of Onset  . Stroke Father   . Heart failure Mother   . Kidney disease Mother   . Kidney disease Unknown   . Arthritis Unknown     Social History   Tobacco Use  . Smoking status: Former Smoker    Types: Cigarettes    Last attempt to quit: 10/25/1978    Years since quitting: 39.6  . Smokeless tobacco: Never Used  Substance Use Topics  . Alcohol use: No  . Drug use: No      BP (!) 176/71   Pulse (!) 59   Ht 5\' 2"  (1.575 m)   Wt 153 lb (69.4 kg)   BMI 27.98 kg/m   The patient appears to be in good health excellent appearance grooming hygiene no deformities Oriented x3 Mood affect pleasant and  normal Gait shows slight antalgic gait favoring the injured extremity  Inspection of the upper extremities right and left revealed no malalignment abnormalities there are no joint contractures and no joint subluxations or dislocations no tremors in the muscle muscle tone is normal skin is in excellent condition pulse and perfusion normal with good temperature of the extremities no lymphadenopathy and normal sensation   Left knee full range of motion Tenderness on the medial joint line and medial tibial plateau and femur No effusion Knee is stable Strength and muscle tone are normal Pulse and perfusion normal sensation intact skin normal   Plan for arthroscopy left knee partial medial meniscectomy may need to continue bracing for the fracture       Encounter Diagnoses  Name Primary?  . Stress fracture, left femur, subsequent encounter for fracture with routine healing   . Derangement of posterior horn of medial meniscus of left knee Yes   Arthroscopy left knee partial medial meniscectomy   The procedure has been fully reviewed with the patient; The risks and benefits of surgery have been discussed and explained and understood. Alternative treatment has also been reviewed, questions were encouraged and answered. The postoperative plan is also been reviewed.  IMPRESSION: 1. Complex tear of the posterior horn medial meniscus with a probable complete radial  tear of the posteromedial corner. 2. Focal insufficiency fracture of central portion of the medial femoral condyle, best seen on image 17 of series 6. Adjacent bone and soft tissue edema.     Electronically Signed   By: Lorriane Shire M.D.   On: 05/10/2018 08:27

## 2018-06-28 ENCOUNTER — Telehealth: Payer: Self-pay | Admitting: Radiology

## 2018-06-28 ENCOUNTER — Other Ambulatory Visit (HOSPITAL_COMMUNITY): Payer: Self-pay | Admitting: Family Medicine

## 2018-06-28 DIAGNOSIS — R030 Elevated blood-pressure reading, without diagnosis of hypertension: Secondary | ICD-10-CM

## 2018-06-28 DIAGNOSIS — I10 Essential (primary) hypertension: Secondary | ICD-10-CM | POA: Diagnosis not present

## 2018-06-28 DIAGNOSIS — E663 Overweight: Secondary | ICD-10-CM | POA: Diagnosis not present

## 2018-06-28 DIAGNOSIS — Z6828 Body mass index (BMI) 28.0-28.9, adult: Secondary | ICD-10-CM | POA: Diagnosis not present

## 2018-06-28 NOTE — Telephone Encounter (Signed)
Patient is at her primary care office today with a hypertensive emergency  Her primary care has recommended she not proceed with surgery tomorrow.  I will  Call her to RS, in 6 weeks or so.   To you FYI

## 2018-06-29 ENCOUNTER — Ambulatory Visit (HOSPITAL_COMMUNITY)
Admission: RE | Admit: 2018-06-29 | Discharge: 2018-06-29 | Disposition: A | Discharge status: Home / Self Care | Payer: PPO | Source: Ambulatory Visit | Attending: Family Medicine | Admitting: Family Medicine

## 2018-06-29 DIAGNOSIS — E663 Overweight: Secondary | ICD-10-CM | POA: Diagnosis not present

## 2018-06-29 DIAGNOSIS — Z1389 Encounter for screening for other disorder: Secondary | ICD-10-CM | POA: Diagnosis not present

## 2018-06-29 DIAGNOSIS — Z6827 Body mass index (BMI) 27.0-27.9, adult: Secondary | ICD-10-CM | POA: Diagnosis not present

## 2018-06-29 DIAGNOSIS — R030 Elevated blood-pressure reading, without diagnosis of hypertension: Secondary | ICD-10-CM | POA: Insufficient documentation

## 2018-06-29 DIAGNOSIS — I10 Essential (primary) hypertension: Secondary | ICD-10-CM | POA: Diagnosis not present

## 2018-06-30 ENCOUNTER — Other Ambulatory Visit (HOSPITAL_COMMUNITY): Payer: Self-pay | Admitting: Family Medicine

## 2018-06-30 ENCOUNTER — Telehealth: Payer: Self-pay | Admitting: Radiology

## 2018-06-30 DIAGNOSIS — E663 Overweight: Secondary | ICD-10-CM | POA: Diagnosis not present

## 2018-06-30 DIAGNOSIS — R030 Elevated blood-pressure reading, without diagnosis of hypertension: Secondary | ICD-10-CM

## 2018-06-30 DIAGNOSIS — F419 Anxiety disorder, unspecified: Secondary | ICD-10-CM | POA: Diagnosis not present

## 2018-06-30 DIAGNOSIS — Z6827 Body mass index (BMI) 27.0-27.9, adult: Secondary | ICD-10-CM | POA: Diagnosis not present

## 2018-06-30 DIAGNOSIS — I10 Essential (primary) hypertension: Secondary | ICD-10-CM

## 2018-06-30 NOTE — Telephone Encounter (Signed)
I spoke to Dr Aline Brochure about this, he will RS when patient gets cleared by her primary care. I have called patient to advise her to let us know when she is able to proceed. I left message for her to call me back

## 2018-06-30 NOTE — Telephone Encounter (Signed)
Ok to cancel the follow up appointment.I called her  back to discuss, we just need to know when she has been cleared for surgery. She did not answer.

## 2018-06-30 NOTE — Telephone Encounter (Signed)
Patient called back, received voice message - states she received Amy's call. States her primary care, Dr Hilma Favors, is having her see a vascular vein specialist today through [facility]; said waiting on a call back from them. Said she may not need the follow up/post op appointment for next Friday, 07/07/18.

## 2018-06-30 NOTE — Telephone Encounter (Signed)
I called patient back and got her voicemail. Left message that we will cancel the appointment on 9/13 and that Amy had said to give [place] a call when her blood pressure is under control. The 9/13 appt is canceled.

## 2018-07-03 ENCOUNTER — Telehealth: Payer: Self-pay | Admitting: Radiology

## 2018-07-03 NOTE — Telephone Encounter (Signed)
I have called to speak to [employer] regarding the prior authorization for the knee arthroscopy, since the date has changed. The authorization is good from  06/21/18-09/19/18, so no further action is required.

## 2018-07-03 NOTE — Telephone Encounter (Signed)
Spoke to patient to RS surgery to 07/06/18 she states her doctor has changed her meds and has told her she is okay to schedule

## 2018-07-05 ENCOUNTER — Encounter (HOSPITAL_COMMUNITY)
Admission: RE | Admit: 2018-07-05 | Discharge: 2018-07-05 | Disposition: A | Discharge status: Home / Self Care | Payer: PPO | Source: Ambulatory Visit | Attending: Orthopedic Surgery | Admitting: Orthopedic Surgery

## 2018-07-05 ENCOUNTER — Encounter (HOSPITAL_COMMUNITY): Payer: Self-pay

## 2018-07-05 DIAGNOSIS — M2242 Chondromalacia patellae, left knee: Secondary | ICD-10-CM | POA: Insufficient documentation

## 2018-07-05 DIAGNOSIS — X58XXXA Exposure to other specified factors, initial encounter: Secondary | ICD-10-CM | POA: Insufficient documentation

## 2018-07-05 DIAGNOSIS — Z87891 Personal history of nicotine dependence: Secondary | ICD-10-CM | POA: Insufficient documentation

## 2018-07-05 DIAGNOSIS — E079 Disorder of thyroid, unspecified: Secondary | ICD-10-CM | POA: Insufficient documentation

## 2018-07-05 DIAGNOSIS — S83232A Complex tear of medial meniscus, current injury, left knee, initial encounter: Secondary | ICD-10-CM | POA: Insufficient documentation

## 2018-07-05 DIAGNOSIS — I10 Essential (primary) hypertension: Secondary | ICD-10-CM | POA: Insufficient documentation

## 2018-07-05 NOTE — H&P (Addendum)
Chief Complaint  Patient presents with  . Knee Pain      left      72 year old female been treated for insufficiency fracture and complex medial meniscal tear we gave her a knee brace she did get some improvement but she is having trouble with her activities of daily living standing walking and walking in the garden   Her MRI shows complex tear of the posterior horn the medial meniscus focal insufficiency fracture central portion medial femoral condyle   She says that she does not want to live with her knee the way it is so we are proceeding with arthroscopy  Spoke to patient to RS surgery to 07/06/18 she states her doctor has changed her meds and has told her she is okay to schedule      Review of systems no change from the previous view of systems Review of Systems  Musculoskeletal: Negative for back pain.  Neurological: Negative for tingling.   Past Medical History:  Diagnosis Date  . Chest pain 03/2011   Exertional and associated with dizziness and dyspnea  . Hyperlipidemia    Lipid profile: 162, 83, 58, 87 in 01/2011  . Hypertension    01/2011-normal CBC, CMetand TSH  . Thyroid disease 1980   s/p partial thyroidectomy    Past Surgical History:  Procedure Laterality Date  . ABDOMINAL HYSTERECTOMY  1986  . CHOLECYSTECTOMY  2003   Laparoscopic  . COLONOSCOPY  04/30/2009   normal rectum/ left sided diverticula/subtle patulous ICV  . THYROIDECTOMY  1980    Family History  Problem Relation Age of Onset  . Stroke Father   . Heart failure Mother   . Kidney disease Mother   . Kidney disease Unknown   . Arthritis Unknown     Social History   Tobacco Use  . Smoking status: Former Smoker    Types: Cigarettes    Last attempt to quit: 10/25/1978    Years since quitting: 39.7  . Smokeless tobacco: Never Used  Substance Use Topics  . Alcohol use: No  . Drug use: No       BP (!) 176/71   Pulse (!) 59   Ht 5\' 2"  (1.575 m)   Wt 153 lb (69.4 kg)   BMI 27.98 kg/m     Physical Exam  Constitutional: She is oriented to person, place, and time. She appears well-developed and well-nourished.  Neurological: She is alert and oriented to person, place, and time.  Psychiatric: She has a normal mood and affect. Judgment normal.  Vitals reviewed.  Right knee none tenderness with full range of motion there is no instability muscle tone is excellent skin looks good pulses and perfusion are normal there are no sensory deficits  Left knee full range of motion Tenderness on the medial joint line and medial tibial plateau and femur No effusion Knee is stable Strength and muscle tone are normal Pulse and perfusion normal sensation intact skin normal     Plan for arthroscopy left knee partial medial meniscectomy may need to continue bracing for the fracture         Encounter Diagnoses  Name Primary?  . Stress fracture, left femur, subsequent encounter for fracture with routine healing    . Derangement of posterior horn of medial meniscus of left knee Yes    Arthroscopy left knee partial medial meniscectomy     The procedure has been fully reviewed with the patient; The risks and benefits of surgery have been discussed and explained  and understood. Alternative treatment has also been reviewed, questions were encouraged and answered. The postoperative plan is also been reviewed.

## 2018-07-06 ENCOUNTER — Ambulatory Visit (HOSPITAL_COMMUNITY)
Admission: RE | Admit: 2018-07-06 | Discharge: 2018-07-06 | Disposition: A | Discharge status: Home / Self Care | Payer: PPO | Source: Ambulatory Visit | Attending: Orthopedic Surgery | Admitting: Orthopedic Surgery

## 2018-07-06 ENCOUNTER — Encounter (HOSPITAL_COMMUNITY)
Admission: RE | Disposition: A | Discharge status: Home / Self Care | Payer: Self-pay | Source: Ambulatory Visit | Attending: Orthopedic Surgery

## 2018-07-06 ENCOUNTER — Ambulatory Visit (HOSPITAL_COMMUNITY): Payer: PPO | Admitting: Anesthesiology

## 2018-07-06 ENCOUNTER — Encounter (HOSPITAL_COMMUNITY): Payer: Self-pay

## 2018-07-06 DIAGNOSIS — X58XXXA Exposure to other specified factors, initial encounter: Secondary | ICD-10-CM | POA: Diagnosis not present

## 2018-07-06 DIAGNOSIS — M2242 Chondromalacia patellae, left knee: Secondary | ICD-10-CM | POA: Diagnosis not present

## 2018-07-06 DIAGNOSIS — M23322 Other meniscus derangements, posterior horn of medial meniscus, left knee: Secondary | ICD-10-CM

## 2018-07-06 DIAGNOSIS — S83232A Complex tear of medial meniscus, current injury, left knee, initial encounter: Secondary | ICD-10-CM | POA: Diagnosis not present

## 2018-07-06 DIAGNOSIS — Z87891 Personal history of nicotine dependence: Secondary | ICD-10-CM | POA: Diagnosis not present

## 2018-07-06 DIAGNOSIS — M23204 Derangement of unspecified medial meniscus due to old tear or injury, left knee: Secondary | ICD-10-CM | POA: Diagnosis not present

## 2018-07-06 DIAGNOSIS — I10 Essential (primary) hypertension: Secondary | ICD-10-CM | POA: Diagnosis not present

## 2018-07-06 DIAGNOSIS — E079 Disorder of thyroid, unspecified: Secondary | ICD-10-CM | POA: Diagnosis not present

## 2018-07-06 DIAGNOSIS — Z9889 Other specified postprocedural states: Secondary | ICD-10-CM

## 2018-07-06 HISTORY — PX: KNEE ARTHROSCOPY WITH MEDIAL MENISECTOMY: SHX5651

## 2018-07-06 SURGERY — ARTHROSCOPY, KNEE, WITH MEDIAL MENISCECTOMY
Anesthesia: General | Laterality: Left

## 2018-07-06 MED ORDER — SODIUM CHLORIDE 0.9 % IR SOLN
Status: DC | PRN
  Administered 2018-07-06: 1000 mL

## 2018-07-06 MED ORDER — ACETAMINOPHEN-CODEINE #3 300-30 MG PO TABS: 1.0000 | ORAL_TABLET | ORAL | 0 refills | Status: AC | PRN

## 2018-07-06 MED ORDER — LIDOCAINE HCL 1 % IJ SOLN
INTRAMUSCULAR | Status: DC | PRN
  Administered 2018-07-06: 25 mg via INTRADERMAL

## 2018-07-06 MED ORDER — IBUPROFEN 400 MG PO TABS
400.0000 mg | ORAL_TABLET | Freq: Once | ORAL | Status: AC
  Administered 2018-07-06: 400 mg via ORAL
  Filled 2018-07-06: qty 1

## 2018-07-06 MED ORDER — LACTATED RINGERS IV SOLN
INTRAVENOUS | Status: DC
  Administered 2018-07-06: 12:00:00 via INTRAVENOUS

## 2018-07-06 MED ORDER — PROPOFOL 10 MG/ML IV BOLUS
INTRAVENOUS | Status: DC | PRN
  Administered 2018-07-06: 150 mg via INTRAVENOUS
  Administered 2018-07-06: 20 mg via INTRAVENOUS

## 2018-07-06 MED ORDER — SODIUM CHLORIDE 0.9 % IR SOLN
Status: DC | PRN
  Administered 2018-07-06 (×4): 3000 mL

## 2018-07-06 MED ORDER — MIDAZOLAM HCL 2 MG/2ML IJ SOLN
INTRAMUSCULAR | Status: AC
  Filled 2018-07-06: qty 2

## 2018-07-06 MED ORDER — BUPIVACAINE-EPINEPHRINE (PF) 0.5% -1:200000 IJ SOLN
INTRAMUSCULAR | Status: DC | PRN
  Administered 2018-07-06 (×2): 30 mL

## 2018-07-06 MED ORDER — MIDAZOLAM HCL 5 MG/5ML IJ SOLN
INTRAMUSCULAR | Status: DC | PRN
  Administered 2018-07-06: 2 mg via INTRAVENOUS

## 2018-07-06 MED ORDER — FENTANYL CITRATE (PF) 100 MCG/2ML IJ SOLN
INTRAMUSCULAR | Status: AC
  Filled 2018-07-06: qty 2

## 2018-07-06 MED ORDER — CHLORHEXIDINE GLUCONATE 4 % EX LIQD: 60.0000 mL | Freq: Once | CUTANEOUS | Status: DC

## 2018-07-06 MED ORDER — HYDROCODONE-ACETAMINOPHEN 7.5-325 MG PO TABS: 1.0000 | ORAL_TABLET | Freq: Once | ORAL | Status: DC | PRN

## 2018-07-06 MED ORDER — BUPIVACAINE-EPINEPHRINE (PF) 0.5% -1:200000 IJ SOLN
INTRAMUSCULAR | Status: AC
  Filled 2018-07-06: qty 60

## 2018-07-06 MED ORDER — LIDOCAINE HCL (PF) 1 % IJ SOLN
INTRAMUSCULAR | Status: AC
  Filled 2018-07-06: qty 5

## 2018-07-06 MED ORDER — VANCOMYCIN HCL IN DEXTROSE 1-5 GM/200ML-% IV SOLN
1000.0000 mg | INTRAVENOUS | Status: AC
  Administered 2018-07-06: 1000 mg via INTRAVENOUS
  Filled 2018-07-06: qty 200

## 2018-07-06 MED ORDER — ACETAMINOPHEN-CODEINE #3 300-30 MG PO TABS
1.0000 | ORAL_TABLET | Freq: Once | ORAL | Status: AC
  Administered 2018-07-06: 1 via ORAL
  Filled 2018-07-06: qty 1

## 2018-07-06 MED ORDER — MIDAZOLAM HCL 2 MG/2ML IJ SOLN: 0.5000 mg | Freq: Once | INTRAMUSCULAR | Status: DC | PRN

## 2018-07-06 MED ORDER — ACETAMINOPHEN 500 MG PO TABS
500.0000 mg | ORAL_TABLET | Freq: Once | ORAL | Status: AC
  Administered 2018-07-06: 500 mg via ORAL
  Filled 2018-07-06: qty 1

## 2018-07-06 MED ORDER — EPINEPHRINE PF 1 MG/ML IJ SOLN
INTRAMUSCULAR | Status: AC
  Filled 2018-07-06: qty 3

## 2018-07-06 MED ORDER — FENTANYL CITRATE (PF) 100 MCG/2ML IJ SOLN
INTRAMUSCULAR | Status: DC | PRN
  Administered 2018-07-06: 50 ug via INTRAVENOUS
  Administered 2018-07-06: 25 ug via INTRAVENOUS

## 2018-07-06 MED ORDER — PROMETHAZINE HCL 25 MG/ML IJ SOLN: 6.2500 mg | INTRAMUSCULAR | Status: DC | PRN

## 2018-07-06 MED ORDER — HYDROMORPHONE HCL 1 MG/ML IJ SOLN: 0.2500 mg | INTRAMUSCULAR | Status: DC | PRN

## 2018-07-06 SURGICAL SUPPLY — 51 items
BANDAGE ELASTIC 6 LF NS (GAUZE/BANDAGES/DRESSINGS) ×2 IMPLANT
BLADE AGGRESSIVE PLUS 4.0 (BLADE) ×2 IMPLANT
BLADE SURG SZ11 CARB STEEL (BLADE) ×2 IMPLANT
BNDG CMPR MED 5X6 ELC HKLP NS (GAUZE/BANDAGES/DRESSINGS) ×1
CHLORAPREP W/TINT 26ML (MISCELLANEOUS) ×2 IMPLANT
CLOTH BEACON ORANGE TIMEOUT ST (SAFETY) ×2 IMPLANT
COOLER CRYO IC GRAV AND TUBE (ORTHOPEDIC SUPPLIES) ×2 IMPLANT
CUFF CRYO KNEE18X23 MED (MISCELLANEOUS) ×1 IMPLANT
CUFF TOURNIQUET SINGLE 34IN LL (TOURNIQUET CUFF) ×1 IMPLANT
CUTTER TOMCAT 5.0MM (BURR) ×1 IMPLANT
DECANTER SPIKE VIAL GLASS SM (MISCELLANEOUS) ×4 IMPLANT
GAUZE 4X4 16PLY RFD (DISPOSABLE) ×2 IMPLANT
GAUZE SPONGE 4X4 12PLY STRL (GAUZE/BANDAGES/DRESSINGS) ×2 IMPLANT
GAUZE SPONGE 4X4 16PLY XRAY LF (GAUZE/BANDAGES/DRESSINGS) ×2 IMPLANT
GAUZE XEROFORM 5X9 LF (GAUZE/BANDAGES/DRESSINGS) ×2 IMPLANT
GLOVE BIOGEL M 7.0 STRL (GLOVE) ×1 IMPLANT
GLOVE BIOGEL PI IND STRL 7.0 (GLOVE) ×2 IMPLANT
GLOVE BIOGEL PI INDICATOR 7.0 (GLOVE) ×2
GLOVE SKINSENSE NS SZ8.0 LF (GLOVE) ×1
GLOVE SKINSENSE STRL SZ8.0 LF (GLOVE) ×1 IMPLANT
GLOVE SS N UNI LF 8.5 STRL (GLOVE) ×2 IMPLANT
GOWN STRL REUS W/ TWL LRG LVL3 (GOWN DISPOSABLE) ×1 IMPLANT
GOWN STRL REUS W/TWL LRG LVL3 (GOWN DISPOSABLE) ×2
GOWN STRL REUS W/TWL XL LVL3 (GOWN DISPOSABLE) ×2 IMPLANT
HLDR LEG FOAM (MISCELLANEOUS) ×1 IMPLANT
IV NS IRRIG 3000ML ARTHROMATIC (IV SOLUTION) ×6 IMPLANT
KIT BLADEGUARD II DBL (SET/KITS/TRAYS/PACK) ×2 IMPLANT
KIT TURNOVER CYSTO (KITS) ×2 IMPLANT
LEG HOLDER FOAM (MISCELLANEOUS) ×1
MANIFOLD NEPTUNE II (INSTRUMENTS) ×2 IMPLANT
MARKER SKIN DUAL TIP RULER LAB (MISCELLANEOUS) ×2 IMPLANT
NDL HYPO 18GX1.5 BLUNT FILL (NEEDLE) ×1 IMPLANT
NDL HYPO 21X1.5 SAFETY (NEEDLE) ×1 IMPLANT
NDL SPNL 18GX3.5 QUINCKE PK (NEEDLE) ×1 IMPLANT
NEEDLE HYPO 18GX1.5 BLUNT FILL (NEEDLE) ×2 IMPLANT
NEEDLE HYPO 21X1.5 SAFETY (NEEDLE) ×2 IMPLANT
NEEDLE SPNL 18GX3.5 QUINCKE PK (NEEDLE) ×2 IMPLANT
NS IRRIG 1000ML POUR BTL (IV SOLUTION) ×2 IMPLANT
PACK ARTHRO LIMB DRAPE STRL (MISCELLANEOUS) ×2 IMPLANT
PAD ABD 5X9 TENDERSORB (GAUZE/BANDAGES/DRESSINGS) ×2 IMPLANT
PAD ARMBOARD 7.5X6 YLW CONV (MISCELLANEOUS) ×2 IMPLANT
PADDING CAST COTTON 6X4 STRL (CAST SUPPLIES) ×2 IMPLANT
PROBE BIPOLAR 50 DEGREE SUCT (MISCELLANEOUS) ×1 IMPLANT
SET ARTHROSCOPY INST (INSTRUMENTS) ×2 IMPLANT
SET BASIN LINEN APH (SET/KITS/TRAYS/PACK) ×2 IMPLANT
SUT ETHILON 3 0 FSL (SUTURE) ×2 IMPLANT
SYR 10ML LL (SYRINGE) ×2 IMPLANT
SYR 30ML LL (SYRINGE) ×2 IMPLANT
TUBE CONNECTING 12X1/4 (SUCTIONS) ×4 IMPLANT
TUBING ARTHRO INFLOW-ONLY STRL (TUBING) ×2 IMPLANT
WHISKER CUTTER 4 (BUR) ×1 IMPLANT

## 2018-07-06 NOTE — Anesthesia Postprocedure Evaluation (Signed)
Anesthesia Post Note  Patient: Kendra Hancock  Procedure(s) Performed: LEFT KNEE ARTHROSCOPY WITH PARTIAL MEDIAL MENISECTOMY (Left )  Patient location during evaluation: PACU Anesthesia Type: General Level of consciousness: awake and alert and patient cooperative Pain management: satisfactory to patient Vital Signs Assessment: post-procedure vital signs reviewed and stable Respiratory status: spontaneous breathing Cardiovascular status: stable Postop Assessment: no apparent nausea or vomiting Anesthetic complications: no     Last Vitals:  Vitals:   07/06/18 1415 07/06/18 1425  BP: (!) 146/55   Pulse: (!) 55 (!) 49  Resp: 14 15  Temp:    SpO2: 100% 98%    Last Pain:  Vitals:   07/06/18 1415  TempSrc:   PainSc: 0-No pain                 [name]

## 2018-07-06 NOTE — Anesthesia Preprocedure Evaluation (Addendum)
Anesthesia Evaluation  Patient identified by MRN, date of birth, ID band Patient awake    Reviewed: Allergy & Precautions, NPO status , Patient's Chart, lab work & pertinent test results  Airway Mallampati: II  TM Distance: >3 FB Neck ROM: Full  Mouth opening: Limited Mouth Opening  Dental no notable dental hx. (+) Teeth Intact   Pulmonary neg pulmonary ROS, former smoker,    Pulmonary exam normal breath sounds clear to auscultation       Cardiovascular Exercise Tolerance: Good hypertension, Pt. on medications negative cardio ROS Normal cardiovascular examI Rhythm:Regular Rate:Normal  Recently added Cardiazem for tighter BP control States anxious today Took 1/4 of a xanax this am  Reports being quite active  Denies CP/DOE   Neuro/Psych negative neurological ROS  negative psych ROS   GI/Hepatic negative GI ROS, Neg liver ROS,   Endo/Other  negative endocrine ROSHypothyroidism   Renal/GU negative Renal ROS  negative genitourinary   Musculoskeletal negative musculoskeletal ROS (+) Arthritis , Osteoarthritis,    Abdominal   Peds negative pediatric ROS (+)  Hematology negative hematology ROS (+)   Anesthesia Other Findings   Reproductive/Obstetrics negative OB ROS                            Anesthesia Physical Anesthesia Plan  ASA: II  Anesthesia Plan: General   Post-op Pain Management:    Induction: Intravenous  PONV Risk Score and Plan:   Airway Management Planned: LMA  Additional Equipment:   Intra-op Plan:   Post-operative Plan:   Informed Consent: I have reviewed the patients History and Physical, chart, labs and discussed the procedure including the risks, benefits and alternatives for the proposed anesthesia with the patient or authorized representative who has indicated his/her understanding and acceptance.   Dental advisory given  Plan Discussed with:  CRNA  Anesthesia Plan Comments:         Anesthesia Quick Evaluation

## 2018-07-06 NOTE — Anesthesia Procedure Notes (Signed)
Procedure Name: LMA Insertion Date/Time: 07/06/2018 12:53 PM Performed by: Charmaine Downs, CRNA Pre-anesthesia Checklist: Patient identified, Timeout performed, Emergency Drugs available, Suction available and Patient being monitored Patient Re-evaluated:Patient Re-evaluated prior to induction Oxygen Delivery Method: Circle system utilized Preoxygenation: Pre-oxygenation with 100% oxygen Induction Type: IV induction Ventilation: Mask ventilation without difficulty LMA: LMA inserted LMA Size: 4.0 Grade View: Grade II Number of attempts: 1 Placement Confirmation: ETT inserted through vocal cords under direct vision and positive ETCO2 Tube secured with: Tape Dental Injury: Teeth and Oropharynx as per pre-operative assessment  Difficulty Due To: Difficulty was anticipated and Difficult Airway- due to limited oral opening

## 2018-07-06 NOTE — Discharge Instructions (Signed)
Surgical findings  The meniscus was torn and it was a severe tear.  It had multiple components which had to be removed.  We removed approximately 20% of the medial meniscus and we also debrided or removed arthritic tissue along the kneecap.        General Anesthesia, Adult, Care After These instructions provide you with information about caring for yourself after your procedure. Your health care provider may also give you more specific instructions. Your treatment has been planned according to current medical practices, but problems sometimes occur. Call your health care provider if you have any problems or questions after your procedure. What can I expect after the procedure? After the procedure, it is common to have:  Vomiting.  A sore throat.  Mental slowness.  It is common to feel:  Nauseous.  Cold or shivery.  Sleepy.  Tired.  Sore or achy, even in parts of your body where you did not have surgery.  Follow these instructions at home: For at least 24 hours after the procedure:  Do not: ? Participate in activities where you could fall or become injured. ? Drive. ? Use heavy machinery. ? Drink alcohol. ? Take sleeping pills or medicines that cause drowsiness. ? Make important decisions or sign legal documents. ? Take care of children on your own.  Rest. Eating and drinking  If you vomit, drink water, juice, or soup when you can drink without vomiting.  Drink enough fluid to keep your urine clear or pale yellow.  Make sure you have little or no nausea before eating solid foods.  Follow the diet recommended by your health care provider. General instructions  Have a responsible adult stay with you until you are awake and alert.  Return to your normal activities as told by your health care provider. Ask your health care provider what activities are safe for you.  Take over-the-counter and prescription medicines only as told by your health care  provider.  If you smoke, do not smoke without supervision.  Keep all follow-up visits as told by your health care provider. This is important. Contact a health care provider if:  You continue to have nausea or vomiting at home, and medicines are not helpful.  You cannot drink fluids or start eating again.  You cannot urinate after 8-12 hours.  You develop a skin rash.  You have fever.  You have increasing redness at the site of your procedure. Get help right away if:  You have difficulty breathing.  You have chest pain.  You have unexpected bleeding.  You feel that you are having a life-threatening or urgent problem. This information is not intended to replace advice given to you by your health care provider. Make sure you discuss any questions you have with your health care provider. Document Released: 01/17/2001 Document Revised: 03/15/2016 Document Reviewed: 09/25/2015 Elsevier Interactive Patient Education  [employer].

## 2018-07-06 NOTE — Interval H&P Note (Signed)
History and Physical Interval Note:  07/06/2018 12:40 PM  Kendra Hancock  has presented today for surgery, with the diagnosis of left knee medial meniscus tear  The various methods of treatment have been discussed with the patient and family. After consideration of risks, benefits and other options for treatment, the patient has consented to  Procedure(s): KNEE ARTHROSCOPY WITH MEDIAL MENISECTOMY (Left) as a surgical intervention .  The patient's history has been reviewed, patient examined, no change in status, stable for surgery.  I have reviewed the patient's chart and labs.  Questions were answered to the patient's satisfaction.     Arther Abbott

## 2018-07-06 NOTE — Transfer of Care (Signed)
Immediate Anesthesia Transfer of Care Note  Patient: Kendra Hancock  Procedure(s) Performed: LEFT KNEE ARTHROSCOPY WITH PARTIAL MEDIAL MENISECTOMY (Left )  Patient Location: PACU  Anesthesia Type:General  Level of Consciousness: patient cooperative  Airway & Oxygen Therapy: Patient Spontanous Breathing  Post-op Assessment: Report given to RN and Post -op Vital signs reviewed and stable  Post vital signs: Reviewed and stable  Last Vitals:  Vitals Value Taken Time  BP 154/56 07/06/2018  2:04 PM  Temp    Pulse 52 07/06/2018  2:07 PM  Resp 16 07/06/2018  2:07 PM  SpO2 99 % 07/06/2018  2:07 PM  Vitals shown include unvalidated device data.  Last Pain:  Vitals:   07/06/18 1150  TempSrc: Oral  PainSc: 0-No pain         Complications: No apparent anesthesia complications

## 2018-07-06 NOTE — Op Note (Signed)
07/06/2018  1:57 PM  PATIENT:  Kendra Hancock  72 y.o. female  PRE-OPERATIVE DIAGNOSIS:  left knee medial meniscus tear  POST-OPERATIVE DIAGNOSIS:  left knee medial meniscus tear  FINDINGS: Mild chondromalacia patella medial facet  Large complex tear medial meniscus at the junction of the posterior horn and body.  The tear originally seem to look like a radial tear that became a large parrot-beak tear and went all the way to the meniscocapsular junction  PROCEDURE:  Procedure(s): LEFT KNEE ARTHROSCOPY WITH PARTIAL MEDIAL MENISECTOMY (Left)   Knee arthroscopy dictation  The patient was identified in the preoperative holding area using 2 approved identification mechanisms. The chart was reviewed and updated. The surgical site was confirmed as left knee and marked with an indelible marker.  The patient was taken to the operating room for anesthesia. After successful general anesthesia, vancomycin secondary to amoxicillin allergy was used as IV antibiotics.  The patient was placed in the supine position with the (left) the operative extremity in an arthroscopic leg holder and the opposite extremity in a padded leg holder.  The timeout was executed.  A lateral portal was established with an 11 blade and the scope was introduced into the joint. A diagnostic arthroscopy was performed in circumferential manner examining the entire knee joint. A medial portal was established and the diagnostic arthroscopy was repeated using a probe to palpate intra-articular structures as they were encountered.   The operative findings are     The medial meniscus was resected using a motorized shaver.  The complex tear had multiple fragments and had to be debrided back to the meniscal rim at the meniscocapsular junction, additional meniscal tissue was resected as needed including switching the scope to the medial portal and addressing it from the lateral side for resection including shaver and ArthroCare 90  degree wand.  I then took a whisker and debrided the chondromalacia of the patella  The arthroscopic pump was placed on the wash mode and any excess debris was removed from the joint using suction.  60 cc of Marcaine with epinephrine was injected through the arthroscope.  The portals were closed with 3-0 nylon suture.  A sterile bandage, Ace wrap and Cryo/Cuff was placed and the Cryo/Cuff was activated. The patient was taken to the recovery room in stable condition.   SURGEON:  Surgeon(s) and Role:    * Carole Civil, MD - Primary  PHYSICIAN ASSISTANT:   ASSISTANTS: none   ANESTHESIA:   general  EBL:  0 mL   BLOOD ADMINISTERED:none  DRAINS: none   LOCAL MEDICATIONS USED:  MARCAINE     SPECIMEN:  No Specimen  DISPOSITION OF SPECIMEN:  N/A  COUNTS:  YES  TOURNIQUET:  * Missing tourniquet times found for documented tourniquets in log: [id number] *  DICTATION: .Dragon Dictation  PLAN OF CARE: Discharge to home after PACU  PATIENT DISPOSITION:  PACU - hemodynamically stable.   Delay start of Pharmacological VTE agent (>24hrs) due to surgical blood loss or risk of bleeding: not applicable

## 2018-07-06 NOTE — Brief Op Note (Signed)
07/06/2018  1:57 PM  PATIENT:  Kendra Hancock  72 y.o. female  PRE-OPERATIVE DIAGNOSIS:  left knee medial meniscus tear  POST-OPERATIVE DIAGNOSIS:  left knee medial meniscus tear  FINDINGS: Mild chondromalacia patella medial facet  Large complex tear medial meniscus at the junction of the posterior horn and body.  The tear originally seem to look like a radial tear that became a large parrot-beak tear and went all the way to the meniscocapsular junction  PROCEDURE:  Procedure(s): LEFT KNEE ARTHROSCOPY WITH PARTIAL MEDIAL MENISECTOMY (Left)   Knee arthroscopy dictation  The patient was identified in the preoperative holding area using 2 approved identification mechanisms. The chart was reviewed and updated. The surgical site was confirmed as left knee and marked with an indelible marker.  The patient was taken to the operating room for anesthesia. After successful general anesthesia, vancomycin secondary to amoxicillin allergy was used as IV antibiotics.  The patient was placed in the supine position with the (left) the operative extremity in an arthroscopic leg holder and the opposite extremity in a padded leg holder.  The timeout was executed.  A lateral portal was established with an 11 blade and the scope was introduced into the joint. A diagnostic arthroscopy was performed in circumferential manner examining the entire knee joint. A medial portal was established and the diagnostic arthroscopy was repeated using a probe to palpate intra-articular structures as they were encountered.   The operative findings are     The medial meniscus was resected using a motorized shaver.  The complex tear had multiple fragments and had to be debrided back to the meniscal rim at the meniscocapsular junction, additional meniscal tissue was resected as needed including switching the scope to the medial portal and addressing it from the lateral side for resection including shaver and ArthroCare 90  degree wand.  I then took a whisker and debrided the chondromalacia of the patella  The arthroscopic pump was placed on the wash mode and any excess debris was removed from the joint using suction.  60 cc of Marcaine with epinephrine was injected through the arthroscope.  The portals were closed with 3-0 nylon suture.  A sterile bandage, Ace wrap and Cryo/Cuff was placed and the Cryo/Cuff was activated. The patient was taken to the recovery room in stable condition.   SURGEON:  Surgeon(s) and Role:    * Carole Civil, MD - Primary  PHYSICIAN ASSISTANT:   ASSISTANTS: none   ANESTHESIA:   general  EBL:  0 mL   BLOOD ADMINISTERED:none  DRAINS: none   LOCAL MEDICATIONS USED:  MARCAINE     SPECIMEN:  No Specimen  DISPOSITION OF SPECIMEN:  N/A  COUNTS:  YES  TOURNIQUET:  * Missing tourniquet times found for documented tourniquets in log: [id number] *  DICTATION: .Dragon Dictation  PLAN OF CARE: Discharge to home after PACU  PATIENT DISPOSITION:  PACU - hemodynamically stable.   Delay start of Pharmacological VTE agent (>24hrs) due to surgical blood loss or risk of bleeding: not applicable

## 2018-07-07 ENCOUNTER — Ambulatory Visit: Payer: PPO | Admitting: Orthopedic Surgery

## 2018-07-07 ENCOUNTER — Encounter (HOSPITAL_COMMUNITY): Payer: Self-pay | Admitting: Orthopedic Surgery

## 2018-07-19 ENCOUNTER — Ambulatory Visit (INDEPENDENT_AMBULATORY_CARE_PROVIDER_SITE_OTHER): Payer: PPO | Admitting: Orthopedic Surgery

## 2018-07-19 VITALS — BP 160/86 | HR 53 | Ht 62.0 in | Wt 151.0 lb

## 2018-07-19 DIAGNOSIS — Z4889 Encounter for other specified surgical aftercare: Secondary | ICD-10-CM

## 2018-07-19 NOTE — Progress Notes (Signed)
Chief Complaint  Patient presents with  . Follow-up    Recheck on left knee, DOS 07-06-18.   07/06/2018  1:57 PM  PATIENT:  Kendra Hancock  72 y.o. female  PRE-OPERATIVE DIAGNOSIS:  left knee medial meniscus tear  POST-OPERATIVE DIAGNOSIS:  left knee medial meniscus tear  FINDINGS: Mild chondromalacia patella medial facet  Large complex tear medial meniscus at the junction of the posterior horn and body.  The tear originally seem to look like a radial tear that became a large parrot-beak tear and went all the way to the meniscocapsular junction  PROCEDURE:  Procedure(s): LEFT KNEE ARTHROSCOPY WITH PARTIAL MEDIAL MENISECTOMY (Left)    Her only complaint is swelling in the back of the knee.  She is otherwise doing well regained full range of motion portals are clean sutures were removed  She will continue exercises and follow-up as needed

## 2018-07-21 ENCOUNTER — Ambulatory Visit: Payer: PPO | Admitting: Orthopedic Surgery

## 2018-08-22 DIAGNOSIS — I10 Essential (primary) hypertension: Secondary | ICD-10-CM | POA: Diagnosis not present

## 2018-08-22 DIAGNOSIS — E782 Mixed hyperlipidemia: Secondary | ICD-10-CM | POA: Diagnosis not present

## 2018-08-22 DIAGNOSIS — E559 Vitamin D deficiency, unspecified: Secondary | ICD-10-CM | POA: Diagnosis not present

## 2018-08-22 DIAGNOSIS — E663 Overweight: Secondary | ICD-10-CM | POA: Diagnosis not present

## 2018-08-22 DIAGNOSIS — Z6828 Body mass index (BMI) 28.0-28.9, adult: Secondary | ICD-10-CM | POA: Diagnosis not present

## 2018-08-22 DIAGNOSIS — Z23 Encounter for immunization: Secondary | ICD-10-CM | POA: Diagnosis not present

## 2018-08-22 DIAGNOSIS — Z0001 Encounter for general adult medical examination with abnormal findings: Secondary | ICD-10-CM | POA: Diagnosis not present

## 2018-08-22 DIAGNOSIS — Z1389 Encounter for screening for other disorder: Secondary | ICD-10-CM | POA: Diagnosis not present

## 2018-08-22 DIAGNOSIS — E039 Hypothyroidism, unspecified: Secondary | ICD-10-CM | POA: Diagnosis not present

## 2018-09-07 DIAGNOSIS — Z1283 Encounter for screening for malignant neoplasm of skin: Secondary | ICD-10-CM | POA: Diagnosis not present

## 2018-09-07 DIAGNOSIS — D225 Melanocytic nevi of trunk: Secondary | ICD-10-CM | POA: Diagnosis not present

## 2018-09-07 DIAGNOSIS — Z85820 Personal history of malignant melanoma of skin: Secondary | ICD-10-CM | POA: Diagnosis not present

## 2018-09-07 DIAGNOSIS — X32XXXD Exposure to sunlight, subsequent encounter: Secondary | ICD-10-CM | POA: Diagnosis not present

## 2018-09-07 DIAGNOSIS — L570 Actinic keratosis: Secondary | ICD-10-CM | POA: Diagnosis not present

## 2018-09-07 DIAGNOSIS — Z08 Encounter for follow-up examination after completed treatment for malignant neoplasm: Secondary | ICD-10-CM | POA: Diagnosis not present

## 2018-10-23 DIAGNOSIS — I10 Essential (primary) hypertension: Secondary | ICD-10-CM | POA: Diagnosis not present

## 2018-10-23 DIAGNOSIS — R609 Edema, unspecified: Secondary | ICD-10-CM | POA: Diagnosis not present

## 2018-10-23 DIAGNOSIS — E663 Overweight: Secondary | ICD-10-CM | POA: Diagnosis not present

## 2018-10-23 DIAGNOSIS — Z6828 Body mass index (BMI) 28.0-28.9, adult: Secondary | ICD-10-CM | POA: Diagnosis not present

## 2018-10-26 ENCOUNTER — Other Ambulatory Visit (HOSPITAL_COMMUNITY): Payer: Self-pay | Admitting: Family Medicine

## 2018-10-26 DIAGNOSIS — E2839 Other primary ovarian failure: Secondary | ICD-10-CM

## 2018-11-01 ENCOUNTER — Ambulatory Visit (HOSPITAL_COMMUNITY)
Admission: RE | Admit: 2018-11-01 | Discharge: 2018-11-01 | Disposition: A | Discharge status: Home / Self Care | Payer: PPO | Source: Ambulatory Visit | Attending: Family Medicine | Admitting: Family Medicine

## 2018-11-01 DIAGNOSIS — M8589 Other specified disorders of bone density and structure, multiple sites: Secondary | ICD-10-CM | POA: Diagnosis not present

## 2018-11-01 DIAGNOSIS — E2839 Other primary ovarian failure: Secondary | ICD-10-CM | POA: Diagnosis not present

## 2018-11-28 DIAGNOSIS — H524 Presbyopia: Secondary | ICD-10-CM | POA: Diagnosis not present

## 2019-01-29 DIAGNOSIS — Z23 Encounter for immunization: Secondary | ICD-10-CM | POA: Diagnosis not present

## 2019-01-29 DIAGNOSIS — Z0001 Encounter for general adult medical examination with abnormal findings: Secondary | ICD-10-CM | POA: Diagnosis not present

## 2019-01-29 DIAGNOSIS — Z1389 Encounter for screening for other disorder: Secondary | ICD-10-CM | POA: Diagnosis not present

## 2019-01-29 DIAGNOSIS — D126 Benign neoplasm of colon, unspecified: Secondary | ICD-10-CM | POA: Diagnosis not present

## 2019-04-17 ENCOUNTER — Encounter: Payer: Self-pay | Admitting: Internal Medicine

## 2019-05-10 DIAGNOSIS — L570 Actinic keratosis: Secondary | ICD-10-CM | POA: Diagnosis not present

## 2019-05-10 DIAGNOSIS — X32XXXD Exposure to sunlight, subsequent encounter: Secondary | ICD-10-CM | POA: Diagnosis not present

## 2019-05-30 DIAGNOSIS — Z1211 Encounter for screening for malignant neoplasm of colon: Secondary | ICD-10-CM | POA: Diagnosis not present

## 2019-06-28 DIAGNOSIS — C44622 Squamous cell carcinoma of skin of right upper limb, including shoulder: Secondary | ICD-10-CM | POA: Diagnosis not present

## 2019-07-04 DIAGNOSIS — Z23 Encounter for immunization: Secondary | ICD-10-CM | POA: Diagnosis not present

## 2019-07-24 ENCOUNTER — Other Ambulatory Visit: Payer: Self-pay | Admitting: Family Medicine

## 2019-07-24 DIAGNOSIS — Z1231 Encounter for screening mammogram for malignant neoplasm of breast: Secondary | ICD-10-CM

## 2019-08-16 DIAGNOSIS — E7849 Other hyperlipidemia: Secondary | ICD-10-CM | POA: Diagnosis not present

## 2019-08-16 DIAGNOSIS — F419 Anxiety disorder, unspecified: Secondary | ICD-10-CM | POA: Diagnosis not present

## 2019-08-16 DIAGNOSIS — E039 Hypothyroidism, unspecified: Secondary | ICD-10-CM | POA: Diagnosis not present

## 2019-08-16 DIAGNOSIS — I10 Essential (primary) hypertension: Secondary | ICD-10-CM | POA: Diagnosis not present

## 2019-08-17 DIAGNOSIS — F419 Anxiety disorder, unspecified: Secondary | ICD-10-CM | POA: Diagnosis not present

## 2019-08-17 DIAGNOSIS — I10 Essential (primary) hypertension: Secondary | ICD-10-CM | POA: Diagnosis not present

## 2019-08-17 DIAGNOSIS — E7849 Other hyperlipidemia: Secondary | ICD-10-CM | POA: Diagnosis not present

## 2019-08-17 DIAGNOSIS — E559 Vitamin D deficiency, unspecified: Secondary | ICD-10-CM | POA: Diagnosis not present

## 2019-08-17 DIAGNOSIS — E039 Hypothyroidism, unspecified: Secondary | ICD-10-CM | POA: Diagnosis not present

## 2019-08-30 DIAGNOSIS — Z08 Encounter for follow-up examination after completed treatment for malignant neoplasm: Secondary | ICD-10-CM | POA: Diagnosis not present

## 2019-08-30 DIAGNOSIS — L820 Inflamed seborrheic keratosis: Secondary | ICD-10-CM | POA: Diagnosis not present

## 2019-08-30 DIAGNOSIS — L570 Actinic keratosis: Secondary | ICD-10-CM | POA: Diagnosis not present

## 2019-08-30 DIAGNOSIS — X32XXXD Exposure to sunlight, subsequent encounter: Secondary | ICD-10-CM | POA: Diagnosis not present

## 2019-08-30 DIAGNOSIS — Z85820 Personal history of malignant melanoma of skin: Secondary | ICD-10-CM | POA: Diagnosis not present

## 2019-08-30 DIAGNOSIS — Z85828 Personal history of other malignant neoplasm of skin: Secondary | ICD-10-CM | POA: Diagnosis not present

## 2019-08-30 DIAGNOSIS — Z1283 Encounter for screening for malignant neoplasm of skin: Secondary | ICD-10-CM | POA: Diagnosis not present

## 2019-09-10 ENCOUNTER — Other Ambulatory Visit: Payer: Self-pay

## 2019-09-10 ENCOUNTER — Ambulatory Visit
Admission: RE | Admit: 2019-09-10 | Discharge: 2019-09-10 | Disposition: A | Discharge status: Home / Self Care | Payer: PPO | Source: Ambulatory Visit | Attending: Family Medicine | Admitting: Family Medicine

## 2019-09-10 DIAGNOSIS — Z1231 Encounter for screening mammogram for malignant neoplasm of breast: Secondary | ICD-10-CM

## 2019-09-27 DIAGNOSIS — C44629 Squamous cell carcinoma of skin of left upper limb, including shoulder: Secondary | ICD-10-CM | POA: Diagnosis not present

## 2019-10-03 DIAGNOSIS — E063 Autoimmune thyroiditis: Secondary | ICD-10-CM | POA: Diagnosis not present

## 2020-01-23 DIAGNOSIS — E039 Hypothyroidism, unspecified: Secondary | ICD-10-CM | POA: Diagnosis not present

## 2020-01-23 DIAGNOSIS — I10 Essential (primary) hypertension: Secondary | ICD-10-CM | POA: Diagnosis not present

## 2020-01-23 DIAGNOSIS — E7849 Other hyperlipidemia: Secondary | ICD-10-CM | POA: Diagnosis not present

## 2020-01-30 DIAGNOSIS — E782 Mixed hyperlipidemia: Secondary | ICD-10-CM | POA: Diagnosis not present

## 2020-01-30 DIAGNOSIS — Z1389 Encounter for screening for other disorder: Secondary | ICD-10-CM | POA: Diagnosis not present

## 2020-01-30 DIAGNOSIS — E559 Vitamin D deficiency, unspecified: Secondary | ICD-10-CM | POA: Diagnosis not present

## 2020-01-30 DIAGNOSIS — E7849 Other hyperlipidemia: Secondary | ICD-10-CM | POA: Diagnosis not present

## 2020-01-30 DIAGNOSIS — Z6827 Body mass index (BMI) 27.0-27.9, adult: Secondary | ICD-10-CM | POA: Diagnosis not present

## 2020-01-30 DIAGNOSIS — Z0001 Encounter for general adult medical examination with abnormal findings: Secondary | ICD-10-CM | POA: Diagnosis not present

## 2020-01-30 DIAGNOSIS — E039 Hypothyroidism, unspecified: Secondary | ICD-10-CM | POA: Diagnosis not present

## 2020-01-30 DIAGNOSIS — D473 Essential (hemorrhagic) thrombocythemia: Secondary | ICD-10-CM | POA: Diagnosis not present

## 2020-01-30 DIAGNOSIS — I10 Essential (primary) hypertension: Secondary | ICD-10-CM | POA: Diagnosis not present

## 2020-04-17 DIAGNOSIS — Z08 Encounter for follow-up examination after completed treatment for malignant neoplasm: Secondary | ICD-10-CM | POA: Diagnosis not present

## 2020-04-17 DIAGNOSIS — Z85820 Personal history of malignant melanoma of skin: Secondary | ICD-10-CM | POA: Diagnosis not present

## 2020-04-17 DIAGNOSIS — L570 Actinic keratosis: Secondary | ICD-10-CM | POA: Diagnosis not present

## 2020-04-17 DIAGNOSIS — X32XXXD Exposure to sunlight, subsequent encounter: Secondary | ICD-10-CM | POA: Diagnosis not present

## 2020-04-17 DIAGNOSIS — Z85828 Personal history of other malignant neoplasm of skin: Secondary | ICD-10-CM | POA: Diagnosis not present

## 2020-05-14 DIAGNOSIS — H353131 Nonexudative age-related macular degeneration, bilateral, early dry stage: Secondary | ICD-10-CM | POA: Diagnosis not present

## 2020-05-14 DIAGNOSIS — H524 Presbyopia: Secondary | ICD-10-CM | POA: Diagnosis not present

## 2020-05-23 DIAGNOSIS — I10 Essential (primary) hypertension: Secondary | ICD-10-CM | POA: Diagnosis not present

## 2020-05-23 DIAGNOSIS — E7849 Other hyperlipidemia: Secondary | ICD-10-CM | POA: Diagnosis not present

## 2020-05-23 DIAGNOSIS — E039 Hypothyroidism, unspecified: Secondary | ICD-10-CM | POA: Diagnosis not present

## 2020-07-08 DIAGNOSIS — Z23 Encounter for immunization: Secondary | ICD-10-CM | POA: Diagnosis not present

## 2020-07-24 DIAGNOSIS — E7849 Other hyperlipidemia: Secondary | ICD-10-CM | POA: Diagnosis not present

## 2020-07-24 DIAGNOSIS — I10 Essential (primary) hypertension: Secondary | ICD-10-CM | POA: Diagnosis not present

## 2020-07-24 DIAGNOSIS — E039 Hypothyroidism, unspecified: Secondary | ICD-10-CM | POA: Diagnosis not present

## 2020-07-30 DIAGNOSIS — E039 Hypothyroidism, unspecified: Secondary | ICD-10-CM | POA: Diagnosis not present

## 2020-07-30 DIAGNOSIS — E663 Overweight: Secondary | ICD-10-CM | POA: Diagnosis not present

## 2020-07-30 DIAGNOSIS — Z6827 Body mass index (BMI) 27.0-27.9, adult: Secondary | ICD-10-CM | POA: Diagnosis not present

## 2020-07-30 DIAGNOSIS — I10 Essential (primary) hypertension: Secondary | ICD-10-CM | POA: Diagnosis not present

## 2020-07-30 DIAGNOSIS — Z719 Counseling, unspecified: Secondary | ICD-10-CM | POA: Diagnosis not present

## 2020-09-04 DIAGNOSIS — L570 Actinic keratosis: Secondary | ICD-10-CM | POA: Diagnosis not present

## 2020-09-04 DIAGNOSIS — Z85820 Personal history of malignant melanoma of skin: Secondary | ICD-10-CM | POA: Diagnosis not present

## 2020-09-04 DIAGNOSIS — D045 Carcinoma in situ of skin of trunk: Secondary | ICD-10-CM | POA: Diagnosis not present

## 2020-09-04 DIAGNOSIS — Z1283 Encounter for screening for malignant neoplasm of skin: Secondary | ICD-10-CM | POA: Diagnosis not present

## 2020-09-04 DIAGNOSIS — B078 Other viral warts: Secondary | ICD-10-CM | POA: Diagnosis not present

## 2020-09-04 DIAGNOSIS — D225 Melanocytic nevi of trunk: Secondary | ICD-10-CM | POA: Diagnosis not present

## 2020-09-04 DIAGNOSIS — Z08 Encounter for follow-up examination after completed treatment for malignant neoplasm: Secondary | ICD-10-CM | POA: Diagnosis not present

## 2020-09-04 DIAGNOSIS — X32XXXD Exposure to sunlight, subsequent encounter: Secondary | ICD-10-CM | POA: Diagnosis not present

## 2020-09-23 DIAGNOSIS — I10 Essential (primary) hypertension: Secondary | ICD-10-CM | POA: Diagnosis not present

## 2020-09-23 DIAGNOSIS — E039 Hypothyroidism, unspecified: Secondary | ICD-10-CM | POA: Diagnosis not present

## 2020-09-23 DIAGNOSIS — E7849 Other hyperlipidemia: Secondary | ICD-10-CM | POA: Diagnosis not present

## 2020-12-31 ENCOUNTER — Other Ambulatory Visit (HOSPITAL_COMMUNITY): Payer: Self-pay | Admitting: Family Medicine

## 2020-12-31 DIAGNOSIS — Z1231 Encounter for screening mammogram for malignant neoplasm of breast: Secondary | ICD-10-CM

## 2021-01-08 ENCOUNTER — Ambulatory Visit (HOSPITAL_COMMUNITY)
Admission: RE | Admit: 2021-01-08 | Discharge: 2021-01-08 | Disposition: A | Discharge status: Home / Self Care | Payer: PPO | Source: Ambulatory Visit | Attending: Family Medicine | Admitting: Family Medicine

## 2021-01-08 DIAGNOSIS — Z1231 Encounter for screening mammogram for malignant neoplasm of breast: Secondary | ICD-10-CM | POA: Insufficient documentation

## 2021-01-14 ENCOUNTER — Other Ambulatory Visit (HOSPITAL_COMMUNITY): Payer: Self-pay | Admitting: Family Medicine

## 2021-01-14 DIAGNOSIS — R928 Other abnormal and inconclusive findings on diagnostic imaging of breast: Secondary | ICD-10-CM

## 2021-01-20 ENCOUNTER — Ambulatory Visit (HOSPITAL_COMMUNITY)
Admission: RE | Admit: 2021-01-20 | Discharge: 2021-01-20 | Disposition: A | Discharge status: Home / Self Care | Payer: PPO | Source: Ambulatory Visit | Attending: Family Medicine | Admitting: Family Medicine

## 2021-01-20 ENCOUNTER — Other Ambulatory Visit (HOSPITAL_COMMUNITY): Payer: Self-pay | Admitting: Family Medicine

## 2021-01-20 DIAGNOSIS — R928 Other abnormal and inconclusive findings on diagnostic imaging of breast: Secondary | ICD-10-CM

## 2021-01-20 DIAGNOSIS — R921 Mammographic calcification found on diagnostic imaging of breast: Secondary | ICD-10-CM | POA: Diagnosis not present

## 2021-01-21 ENCOUNTER — Other Ambulatory Visit (HOSPITAL_COMMUNITY): Payer: Self-pay | Admitting: Family Medicine

## 2021-01-21 DIAGNOSIS — R928 Other abnormal and inconclusive findings on diagnostic imaging of breast: Secondary | ICD-10-CM

## 2021-01-21 DIAGNOSIS — E7849 Other hyperlipidemia: Secondary | ICD-10-CM | POA: Diagnosis not present

## 2021-01-21 DIAGNOSIS — E039 Hypothyroidism, unspecified: Secondary | ICD-10-CM | POA: Diagnosis not present

## 2021-01-21 DIAGNOSIS — I10 Essential (primary) hypertension: Secondary | ICD-10-CM | POA: Diagnosis not present

## 2021-01-21 DIAGNOSIS — R921 Mammographic calcification found on diagnostic imaging of breast: Secondary | ICD-10-CM

## 2021-01-26 ENCOUNTER — Ambulatory Visit
Admission: RE | Admit: 2021-01-26 | Discharge: 2021-01-26 | Disposition: A | Discharge status: Home / Self Care | Payer: PPO | Source: Ambulatory Visit | Attending: Family Medicine | Admitting: Family Medicine

## 2021-01-26 ENCOUNTER — Other Ambulatory Visit: Payer: Self-pay

## 2021-01-26 DIAGNOSIS — N6489 Other specified disorders of breast: Secondary | ICD-10-CM | POA: Diagnosis not present

## 2021-01-26 DIAGNOSIS — R921 Mammographic calcification found on diagnostic imaging of breast: Secondary | ICD-10-CM

## 2021-01-26 DIAGNOSIS — R928 Other abnormal and inconclusive findings on diagnostic imaging of breast: Secondary | ICD-10-CM

## 2021-02-05 DIAGNOSIS — Z85820 Personal history of malignant melanoma of skin: Secondary | ICD-10-CM | POA: Diagnosis not present

## 2021-02-05 DIAGNOSIS — L821 Other seborrheic keratosis: Secondary | ICD-10-CM | POA: Diagnosis not present

## 2021-02-05 DIAGNOSIS — X32XXXD Exposure to sunlight, subsequent encounter: Secondary | ICD-10-CM | POA: Diagnosis not present

## 2021-02-05 DIAGNOSIS — Z08 Encounter for follow-up examination after completed treatment for malignant neoplasm: Secondary | ICD-10-CM | POA: Diagnosis not present

## 2021-02-05 DIAGNOSIS — L570 Actinic keratosis: Secondary | ICD-10-CM | POA: Diagnosis not present

## 2021-02-16 DIAGNOSIS — I10 Essential (primary) hypertension: Secondary | ICD-10-CM | POA: Diagnosis not present

## 2021-02-16 DIAGNOSIS — E039 Hypothyroidism, unspecified: Secondary | ICD-10-CM | POA: Diagnosis not present

## 2021-02-16 DIAGNOSIS — Z0001 Encounter for general adult medical examination with abnormal findings: Secondary | ICD-10-CM | POA: Diagnosis not present

## 2021-02-16 DIAGNOSIS — Z1331 Encounter for screening for depression: Secondary | ICD-10-CM | POA: Diagnosis not present

## 2021-02-16 DIAGNOSIS — E7849 Other hyperlipidemia: Secondary | ICD-10-CM | POA: Diagnosis not present

## 2021-02-16 DIAGNOSIS — Z6827 Body mass index (BMI) 27.0-27.9, adult: Secondary | ICD-10-CM | POA: Diagnosis not present

## 2021-02-17 DIAGNOSIS — Z1389 Encounter for screening for other disorder: Secondary | ICD-10-CM | POA: Diagnosis not present

## 2021-02-17 DIAGNOSIS — I10 Essential (primary) hypertension: Secondary | ICD-10-CM | POA: Diagnosis not present

## 2021-02-17 DIAGNOSIS — E559 Vitamin D deficiency, unspecified: Secondary | ICD-10-CM | POA: Diagnosis not present

## 2021-02-17 DIAGNOSIS — Z0001 Encounter for general adult medical examination with abnormal findings: Secondary | ICD-10-CM | POA: Diagnosis not present

## 2021-02-21 DIAGNOSIS — I10 Essential (primary) hypertension: Secondary | ICD-10-CM | POA: Diagnosis not present

## 2021-02-21 DIAGNOSIS — E039 Hypothyroidism, unspecified: Secondary | ICD-10-CM | POA: Diagnosis not present

## 2021-02-21 DIAGNOSIS — E7849 Other hyperlipidemia: Secondary | ICD-10-CM | POA: Diagnosis not present

## 2021-03-24 DIAGNOSIS — E7849 Other hyperlipidemia: Secondary | ICD-10-CM | POA: Diagnosis not present

## 2021-03-24 DIAGNOSIS — E039 Hypothyroidism, unspecified: Secondary | ICD-10-CM | POA: Diagnosis not present

## 2021-03-24 DIAGNOSIS — I10 Essential (primary) hypertension: Secondary | ICD-10-CM | POA: Diagnosis not present

## 2021-06-10 DIAGNOSIS — H353111 Nonexudative age-related macular degeneration, right eye, early dry stage: Secondary | ICD-10-CM | POA: Diagnosis not present

## 2021-06-10 DIAGNOSIS — H524 Presbyopia: Secondary | ICD-10-CM | POA: Diagnosis not present

## 2021-06-18 DIAGNOSIS — X32XXXD Exposure to sunlight, subsequent encounter: Secondary | ICD-10-CM | POA: Diagnosis not present

## 2021-06-18 DIAGNOSIS — L570 Actinic keratosis: Secondary | ICD-10-CM | POA: Diagnosis not present

## 2021-07-20 DIAGNOSIS — Z23 Encounter for immunization: Secondary | ICD-10-CM | POA: Diagnosis not present

## 2021-08-25 DIAGNOSIS — F419 Anxiety disorder, unspecified: Secondary | ICD-10-CM | POA: Diagnosis not present

## 2021-08-25 DIAGNOSIS — E663 Overweight: Secondary | ICD-10-CM | POA: Diagnosis not present

## 2021-08-25 DIAGNOSIS — I10 Essential (primary) hypertension: Secondary | ICD-10-CM | POA: Diagnosis not present

## 2021-08-25 DIAGNOSIS — E782 Mixed hyperlipidemia: Secondary | ICD-10-CM | POA: Diagnosis not present

## 2021-08-25 DIAGNOSIS — D473 Essential (hemorrhagic) thrombocythemia: Secondary | ICD-10-CM | POA: Diagnosis not present

## 2021-08-25 DIAGNOSIS — E559 Vitamin D deficiency, unspecified: Secondary | ICD-10-CM | POA: Diagnosis not present

## 2021-08-25 DIAGNOSIS — E039 Hypothyroidism, unspecified: Secondary | ICD-10-CM | POA: Diagnosis not present

## 2021-08-25 DIAGNOSIS — Z6826 Body mass index (BMI) 26.0-26.9, adult: Secondary | ICD-10-CM | POA: Diagnosis not present

## 2021-08-25 DIAGNOSIS — E063 Autoimmune thyroiditis: Secondary | ICD-10-CM | POA: Diagnosis not present

## 2021-09-10 DIAGNOSIS — Z1283 Encounter for screening for malignant neoplasm of skin: Secondary | ICD-10-CM | POA: Diagnosis not present

## 2021-09-10 DIAGNOSIS — Z85820 Personal history of malignant melanoma of skin: Secondary | ICD-10-CM | POA: Diagnosis not present

## 2021-09-10 DIAGNOSIS — Z08 Encounter for follow-up examination after completed treatment for malignant neoplasm: Secondary | ICD-10-CM | POA: Diagnosis not present

## 2021-10-07 DIAGNOSIS — D473 Essential (hemorrhagic) thrombocythemia: Secondary | ICD-10-CM | POA: Diagnosis not present

## 2021-10-07 DIAGNOSIS — E782 Mixed hyperlipidemia: Secondary | ICD-10-CM | POA: Diagnosis not present

## 2021-10-07 DIAGNOSIS — E063 Autoimmune thyroiditis: Secondary | ICD-10-CM | POA: Diagnosis not present

## 2021-10-07 DIAGNOSIS — F419 Anxiety disorder, unspecified: Secondary | ICD-10-CM | POA: Diagnosis not present

## 2021-10-07 DIAGNOSIS — E559 Vitamin D deficiency, unspecified: Secondary | ICD-10-CM | POA: Diagnosis not present

## 2021-10-07 DIAGNOSIS — I10 Essential (primary) hypertension: Secondary | ICD-10-CM | POA: Diagnosis not present

## 2021-10-07 DIAGNOSIS — E039 Hypothyroidism, unspecified: Secondary | ICD-10-CM | POA: Diagnosis not present

## 2021-10-07 DIAGNOSIS — E663 Overweight: Secondary | ICD-10-CM | POA: Diagnosis not present

## 2021-10-07 DIAGNOSIS — Z6826 Body mass index (BMI) 26.0-26.9, adult: Secondary | ICD-10-CM | POA: Diagnosis not present

## 2021-11-19 DIAGNOSIS — E039 Hypothyroidism, unspecified: Secondary | ICD-10-CM | POA: Diagnosis not present

## 2022-01-21 DIAGNOSIS — D473 Essential (hemorrhagic) thrombocythemia: Secondary | ICD-10-CM | POA: Diagnosis not present

## 2022-01-21 DIAGNOSIS — E7849 Other hyperlipidemia: Secondary | ICD-10-CM | POA: Diagnosis not present

## 2022-01-21 DIAGNOSIS — E039 Hypothyroidism, unspecified: Secondary | ICD-10-CM | POA: Diagnosis not present

## 2022-01-21 DIAGNOSIS — E663 Overweight: Secondary | ICD-10-CM | POA: Diagnosis not present

## 2022-01-21 DIAGNOSIS — Z1331 Encounter for screening for depression: Secondary | ICD-10-CM | POA: Diagnosis not present

## 2022-01-21 DIAGNOSIS — I10 Essential (primary) hypertension: Secondary | ICD-10-CM | POA: Diagnosis not present

## 2022-01-21 DIAGNOSIS — Z0001 Encounter for general adult medical examination with abnormal findings: Secondary | ICD-10-CM | POA: Diagnosis not present

## 2022-01-21 DIAGNOSIS — Z6826 Body mass index (BMI) 26.0-26.9, adult: Secondary | ICD-10-CM | POA: Diagnosis not present

## 2022-01-21 DIAGNOSIS — F419 Anxiety disorder, unspecified: Secondary | ICD-10-CM | POA: Diagnosis not present

## 2022-01-21 DIAGNOSIS — E559 Vitamin D deficiency, unspecified: Secondary | ICD-10-CM | POA: Diagnosis not present

## 2022-01-21 DIAGNOSIS — E782 Mixed hyperlipidemia: Secondary | ICD-10-CM | POA: Diagnosis not present

## 2022-02-04 ENCOUNTER — Other Ambulatory Visit (HOSPITAL_COMMUNITY): Payer: Self-pay | Admitting: Family Medicine

## 2022-02-04 DIAGNOSIS — Z1231 Encounter for screening mammogram for malignant neoplasm of breast: Secondary | ICD-10-CM

## 2022-02-11 ENCOUNTER — Ambulatory Visit (HOSPITAL_COMMUNITY)
Admission: RE | Admit: 2022-02-11 | Discharge: 2022-02-11 | Disposition: A | Discharge status: Home / Self Care | Payer: PPO | Source: Ambulatory Visit | Attending: Family Medicine | Admitting: Family Medicine

## 2022-02-11 DIAGNOSIS — Z1231 Encounter for screening mammogram for malignant neoplasm of breast: Secondary | ICD-10-CM | POA: Insufficient documentation

## 2022-03-24 DIAGNOSIS — Z6826 Body mass index (BMI) 26.0-26.9, adult: Secondary | ICD-10-CM | POA: Diagnosis not present

## 2022-03-24 DIAGNOSIS — R197 Diarrhea, unspecified: Secondary | ICD-10-CM | POA: Diagnosis not present

## 2022-03-25 DIAGNOSIS — Z08 Encounter for follow-up examination after completed treatment for malignant neoplasm: Secondary | ICD-10-CM | POA: Diagnosis not present

## 2022-03-25 DIAGNOSIS — Z85820 Personal history of malignant melanoma of skin: Secondary | ICD-10-CM | POA: Diagnosis not present

## 2022-03-25 DIAGNOSIS — X32XXXD Exposure to sunlight, subsequent encounter: Secondary | ICD-10-CM | POA: Diagnosis not present

## 2022-03-25 DIAGNOSIS — L570 Actinic keratosis: Secondary | ICD-10-CM | POA: Diagnosis not present

## 2022-03-26 DIAGNOSIS — Z1211 Encounter for screening for malignant neoplasm of colon: Secondary | ICD-10-CM | POA: Diagnosis not present

## 2022-04-07 DIAGNOSIS — E663 Overweight: Secondary | ICD-10-CM | POA: Diagnosis not present

## 2022-04-07 DIAGNOSIS — Z6826 Body mass index (BMI) 26.0-26.9, adult: Secondary | ICD-10-CM | POA: Diagnosis not present

## 2022-04-07 DIAGNOSIS — E039 Hypothyroidism, unspecified: Secondary | ICD-10-CM | POA: Diagnosis not present

## 2022-05-25 DIAGNOSIS — E039 Hypothyroidism, unspecified: Secondary | ICD-10-CM | POA: Diagnosis not present

## 2022-06-14 DIAGNOSIS — H524 Presbyopia: Secondary | ICD-10-CM | POA: Diagnosis not present

## 2022-06-14 DIAGNOSIS — H353111 Nonexudative age-related macular degeneration, right eye, early dry stage: Secondary | ICD-10-CM | POA: Diagnosis not present

## 2022-07-20 DIAGNOSIS — Z23 Encounter for immunization: Secondary | ICD-10-CM | POA: Diagnosis not present

## 2022-08-19 DIAGNOSIS — Z1283 Encounter for screening for malignant neoplasm of skin: Secondary | ICD-10-CM | POA: Diagnosis not present

## 2022-08-19 DIAGNOSIS — Z08 Encounter for follow-up examination after completed treatment for malignant neoplasm: Secondary | ICD-10-CM | POA: Diagnosis not present

## 2022-08-19 DIAGNOSIS — D225 Melanocytic nevi of trunk: Secondary | ICD-10-CM | POA: Diagnosis not present

## 2022-08-19 DIAGNOSIS — Z85820 Personal history of malignant melanoma of skin: Secondary | ICD-10-CM | POA: Diagnosis not present

## 2022-08-19 DIAGNOSIS — C44329 Squamous cell carcinoma of skin of other parts of face: Secondary | ICD-10-CM | POA: Diagnosis not present

## 2022-09-08 DIAGNOSIS — L989 Disorder of the skin and subcutaneous tissue, unspecified: Secondary | ICD-10-CM | POA: Diagnosis not present

## 2022-09-08 DIAGNOSIS — C44329 Squamous cell carcinoma of skin of other parts of face: Secondary | ICD-10-CM | POA: Diagnosis not present

## 2022-10-12 DIAGNOSIS — L578 Other skin changes due to chronic exposure to nonionizing radiation: Secondary | ICD-10-CM | POA: Diagnosis not present

## 2022-10-12 DIAGNOSIS — L570 Actinic keratosis: Secondary | ICD-10-CM | POA: Diagnosis not present

## 2022-10-12 DIAGNOSIS — L905 Scar conditions and fibrosis of skin: Secondary | ICD-10-CM | POA: Diagnosis not present

## 2022-11-15 IMAGING — MG MM DIGITAL SCREENING BILAT W/ TOMO AND CAD
8 series · 9 of 24 positions shown · non-contrast
Comparison: Previous exam(s).

CLINICAL DATA: Screening.

EXAM:
DIGITAL SCREENING BILATERAL MAMMOGRAM WITH TOMOSYNTHESIS AND CAD
TECHNIQUE: Bilateral screening digital craniocaudal and mediolateral oblique
mammograms were obtained. Bilateral screening digital breast
tomosynthesis was performed. The images were evaluated with
computer-aided detection.

[L CC synth-2D]
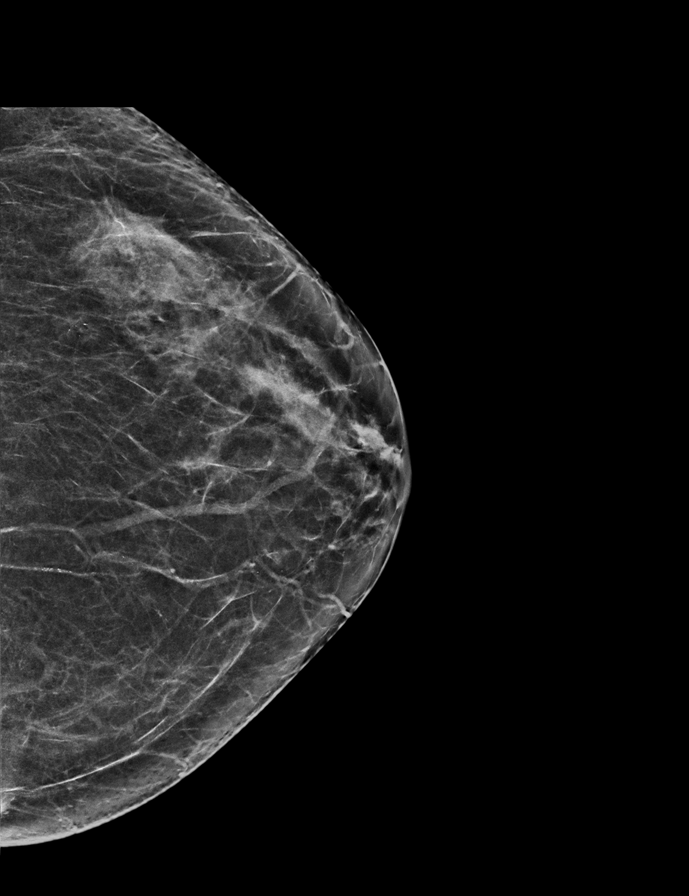

[R MLO synth-2D]
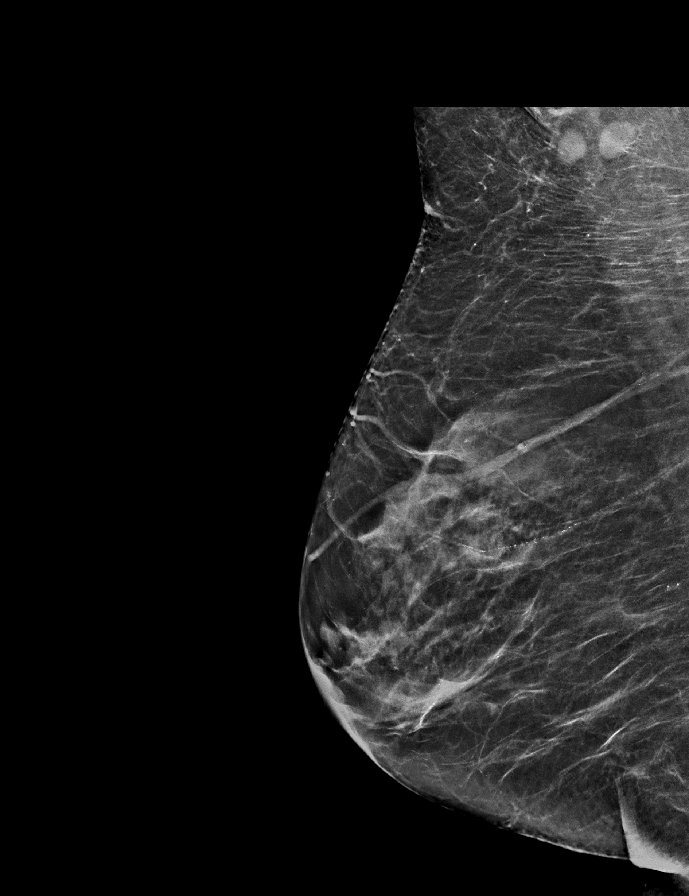

[L MLO synth-2D]
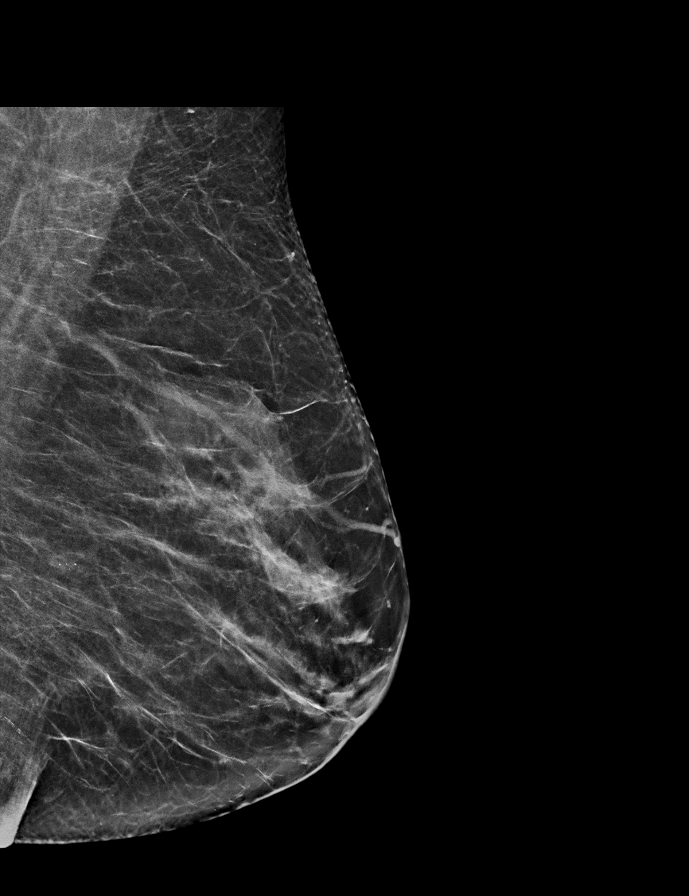

[R CC synth-2D]
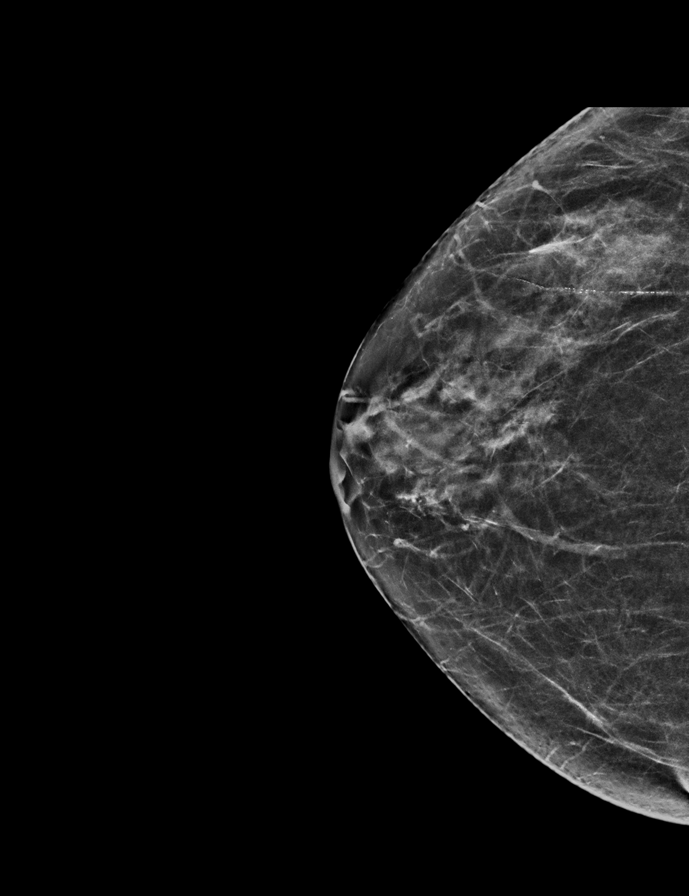

[R MLO tomo · 2 of 63 frames shown]
[frame 21/63]
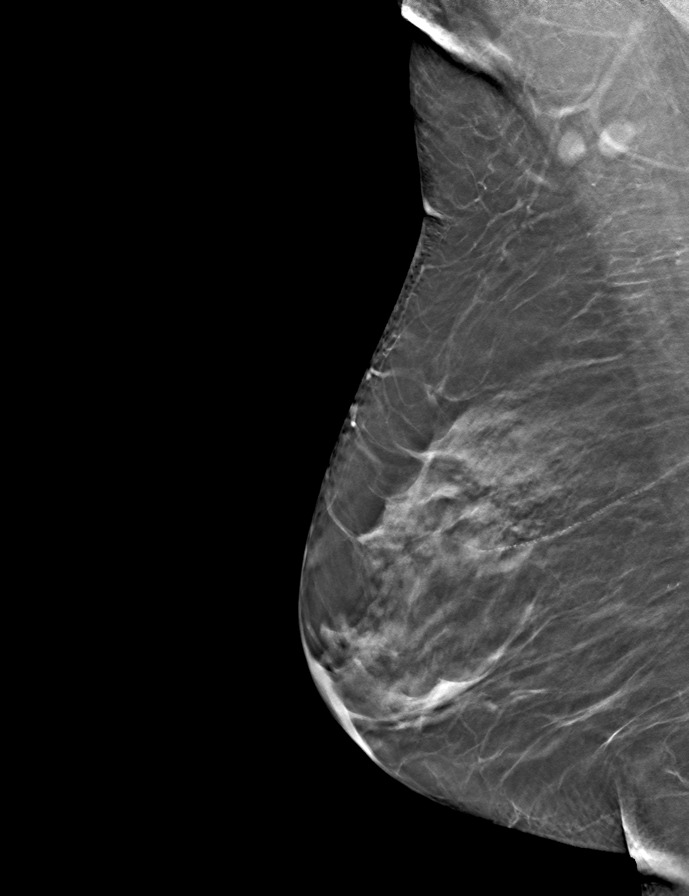
[frame 32/63]
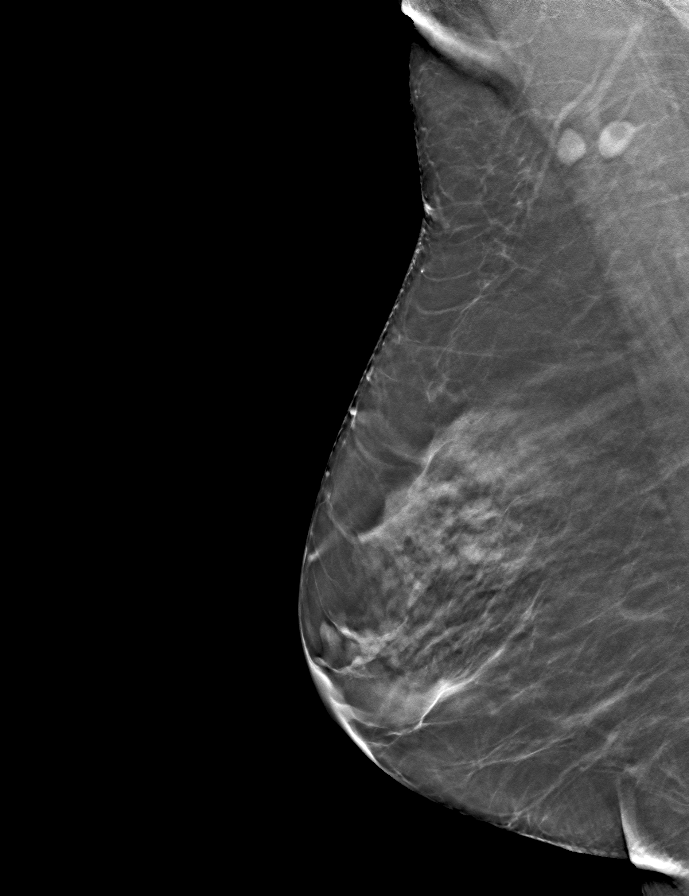

[L MLO tomo · tomo slice 33/66.0]
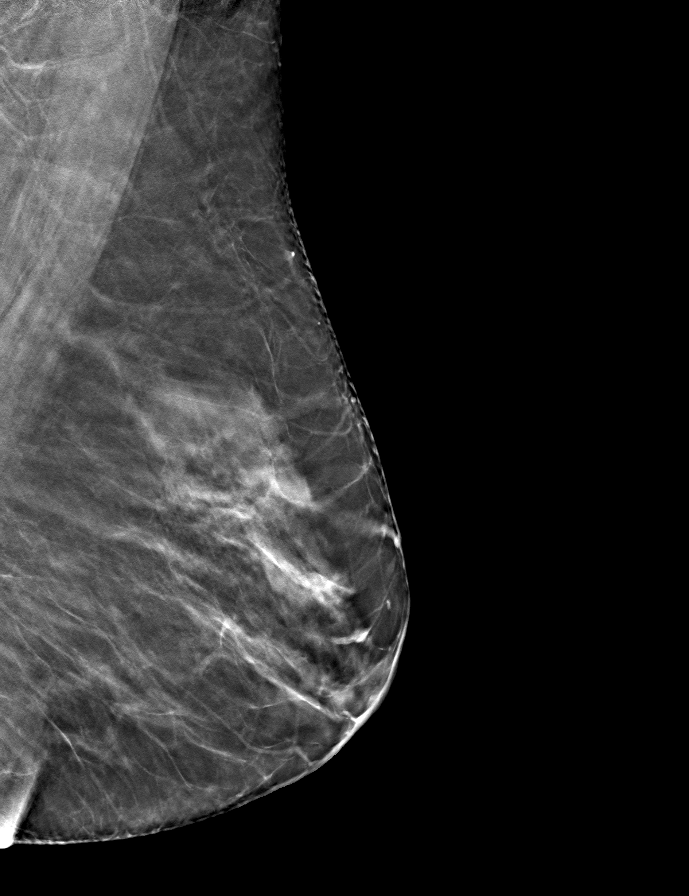

[R CC tomo · tomo slice 29/57.0]
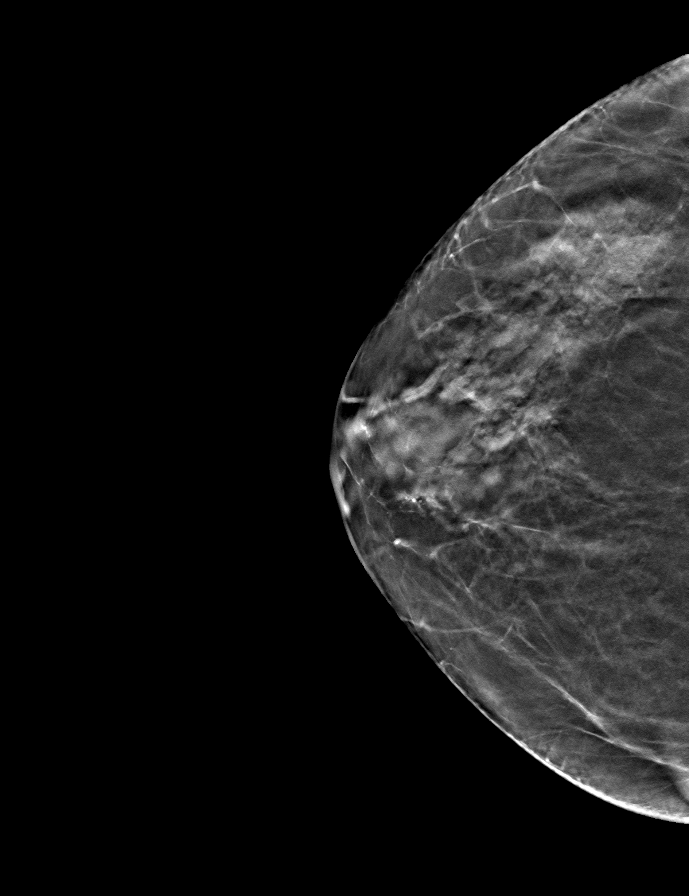

[L CC tomo · tomo slice 30/59.0]
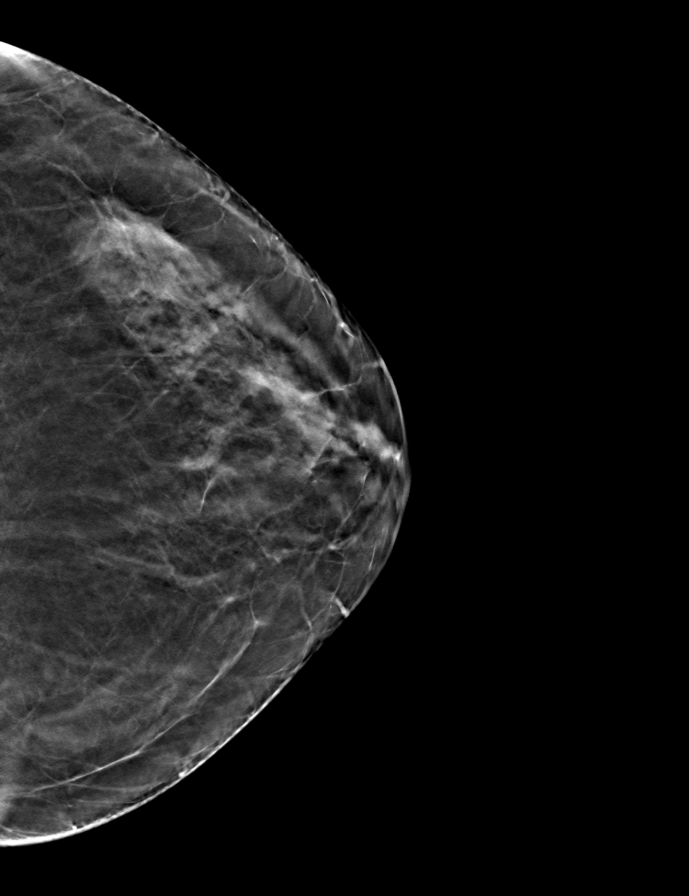

[9 of 24 positions shown; findings below may reference images not displayed]

ACR Breast Density Category c: The breast tissue is heterogeneously
dense, which may obscure small masses.
FINDINGS: In the left breast, calcifications warrant further evaluation. In
the right breast, no findings suspicious for malignancy.
IMPRESSION: Further evaluation is suggested for calcifications in the left
breast.

RECOMMENDATION:
Diagnostic mammogram of the left breast. (Code:[id number]-[id number])

The patient will be contacted regarding the findings, and additional
imaging will be scheduled.

BI-RADS CATEGORY  0: Incomplete. Need additional imaging evaluation
and/or prior mammograms for comparison.

## 2022-12-21 DIAGNOSIS — H353131 Nonexudative age-related macular degeneration, bilateral, early dry stage: Secondary | ICD-10-CM | POA: Diagnosis not present

## 2022-12-23 DIAGNOSIS — D473 Essential (hemorrhagic) thrombocythemia: Secondary | ICD-10-CM | POA: Diagnosis not present

## 2022-12-23 DIAGNOSIS — E782 Mixed hyperlipidemia: Secondary | ICD-10-CM | POA: Diagnosis not present

## 2022-12-23 DIAGNOSIS — E7849 Other hyperlipidemia: Secondary | ICD-10-CM | POA: Diagnosis not present

## 2022-12-23 DIAGNOSIS — E559 Vitamin D deficiency, unspecified: Secondary | ICD-10-CM | POA: Diagnosis not present

## 2022-12-23 DIAGNOSIS — Z1331 Encounter for screening for depression: Secondary | ICD-10-CM | POA: Diagnosis not present

## 2022-12-23 DIAGNOSIS — Z0001 Encounter for general adult medical examination with abnormal findings: Secondary | ICD-10-CM | POA: Diagnosis not present

## 2022-12-23 DIAGNOSIS — Z6825 Body mass index (BMI) 25.0-25.9, adult: Secondary | ICD-10-CM | POA: Diagnosis not present

## 2022-12-23 DIAGNOSIS — E039 Hypothyroidism, unspecified: Secondary | ICD-10-CM | POA: Diagnosis not present

## 2022-12-23 DIAGNOSIS — F419 Anxiety disorder, unspecified: Secondary | ICD-10-CM | POA: Diagnosis not present

## 2022-12-23 DIAGNOSIS — I10 Essential (primary) hypertension: Secondary | ICD-10-CM | POA: Diagnosis not present

## 2022-12-23 DIAGNOSIS — E663 Overweight: Secondary | ICD-10-CM | POA: Diagnosis not present

## 2023-01-19 ENCOUNTER — Other Ambulatory Visit (HOSPITAL_COMMUNITY): Payer: Self-pay | Admitting: Family Medicine

## 2023-01-19 DIAGNOSIS — Z1231 Encounter for screening mammogram for malignant neoplasm of breast: Secondary | ICD-10-CM

## 2023-02-14 ENCOUNTER — Ambulatory Visit (HOSPITAL_COMMUNITY): Payer: PPO

## 2023-02-16 ENCOUNTER — Ambulatory Visit (HOSPITAL_COMMUNITY)
Admission: RE | Admit: 2023-02-16 | Discharge: 2023-02-16 | Disposition: A | Discharge status: Home / Self Care | Payer: PPO | Source: Ambulatory Visit | Attending: Family Medicine | Admitting: Family Medicine

## 2023-02-16 DIAGNOSIS — Z1231 Encounter for screening mammogram for malignant neoplasm of breast: Secondary | ICD-10-CM | POA: Insufficient documentation

## 2023-03-18 ENCOUNTER — Ambulatory Visit (HOSPITAL_COMMUNITY)
Admission: RE | Admit: 2023-03-18 | Discharge: 2023-03-18 | Disposition: A | Discharge status: Home / Self Care | Payer: PPO | Source: Ambulatory Visit | Attending: Family Medicine | Admitting: Family Medicine

## 2023-03-18 ENCOUNTER — Other Ambulatory Visit (HOSPITAL_COMMUNITY): Payer: Self-pay | Admitting: Family Medicine

## 2023-03-18 DIAGNOSIS — J209 Acute bronchitis, unspecified: Secondary | ICD-10-CM

## 2023-03-18 DIAGNOSIS — E663 Overweight: Secondary | ICD-10-CM | POA: Diagnosis not present

## 2023-03-18 DIAGNOSIS — Z6826 Body mass index (BMI) 26.0-26.9, adult: Secondary | ICD-10-CM | POA: Diagnosis not present

## 2023-03-18 DIAGNOSIS — Z20828 Contact with and (suspected) exposure to other viral communicable diseases: Secondary | ICD-10-CM | POA: Diagnosis not present

## 2023-03-18 DIAGNOSIS — R6889 Other general symptoms and signs: Secondary | ICD-10-CM | POA: Diagnosis not present

## 2023-04-04 DIAGNOSIS — K648 Other hemorrhoids: Secondary | ICD-10-CM | POA: Diagnosis not present

## 2023-04-04 DIAGNOSIS — I10 Essential (primary) hypertension: Secondary | ICD-10-CM | POA: Diagnosis not present

## 2023-04-04 DIAGNOSIS — Z6825 Body mass index (BMI) 25.0-25.9, adult: Secondary | ICD-10-CM | POA: Diagnosis not present

## 2023-05-12 DIAGNOSIS — L570 Actinic keratosis: Secondary | ICD-10-CM | POA: Diagnosis not present

## 2023-05-12 DIAGNOSIS — X32XXXD Exposure to sunlight, subsequent encounter: Secondary | ICD-10-CM | POA: Diagnosis not present

## 2023-06-07 DIAGNOSIS — H524 Presbyopia: Secondary | ICD-10-CM | POA: Diagnosis not present

## 2023-06-07 DIAGNOSIS — H353132 Nonexudative age-related macular degeneration, bilateral, intermediate dry stage: Secondary | ICD-10-CM | POA: Diagnosis not present

## 2023-07-21 DIAGNOSIS — H01002 Unspecified blepharitis right lower eyelid: Secondary | ICD-10-CM | POA: Diagnosis not present

## 2023-07-21 DIAGNOSIS — H01001 Unspecified blepharitis right upper eyelid: Secondary | ICD-10-CM | POA: Diagnosis not present

## 2023-07-21 DIAGNOSIS — H25813 Combined forms of age-related cataract, bilateral: Secondary | ICD-10-CM | POA: Diagnosis not present

## 2023-07-21 DIAGNOSIS — H353132 Nonexudative age-related macular degeneration, bilateral, intermediate dry stage: Secondary | ICD-10-CM | POA: Diagnosis not present

## 2023-07-25 DIAGNOSIS — H25812 Combined forms of age-related cataract, left eye: Secondary | ICD-10-CM | POA: Diagnosis not present

## 2023-07-27 ENCOUNTER — Encounter (HOSPITAL_COMMUNITY)
Admission: RE | Admit: 2023-07-27 | Discharge: 2023-07-27 | Disposition: A | Discharge status: Home / Self Care | Payer: PPO | Source: Ambulatory Visit | Attending: Ophthalmology | Admitting: Ophthalmology

## 2023-07-27 ENCOUNTER — Encounter (HOSPITAL_COMMUNITY): Payer: Self-pay

## 2023-07-27 NOTE — H&P (Signed)
Surgical History & Physical  Patient Name: Kendra Hancock  DOB: [birth year]  Surgery: Cataract extraction with intraocular lens implant phacoemulsification; Left Eye Surgeon: [name] MD Surgery Date: 07/29/2023 Pre-Op Date: 07/21/2023  HPI: A 86 Yr. old female patient 1. The patient is here for a cataract consult Ref: by Dr. Earlene Plater. Pt. states vision is blurry x 6 months. Pt. has not noticed which eye is worse. The patient's vision is blurry. The condition's severity is constant. Patient has trouble driving in the dark. This is negatively affecting the patient's quality of life and the patient is unable to function adequately in life with the current level of vision. HPI was performed by [name] .  Medical History:  High Blood Pressure Thyroid Problems  Review of Systems Negative Allergic/Immunologic HTN Cardiovascular Negative Constitutional Negative Ear, Nose, Mouth & Throat Thyroid Problems Endocrine Negative Eyes Negative Gastrointestinal Negative Genitourinary Negative Hemotologic/Lymphatic Negative Integumentary Negative Musculoskeletal Negative Neurological Negative Psychiatry Negative Respiratory  Social Never smoked  Medication PreserVision AREDS ,  diltiazem HCl (Tiazac) ,  levothyroxine ,  hydrochlorothiazide ,  lisinopril    Sx/Procedures Hystectomy, Gallbladder, Knee, Thyroid  Drug Allergies  Amoxicillin  History & Physical: Heent: cataracts NECK: supple without bruits LUNGS: lungs clear to auscultation CV: regular rate and rhythm Abdomen: soft and non-tender  Impression & Plan: Assessment: 1.  COMBINED FORMS AGE RELATED CATARACT; Both Eyes (H25.813) 2.  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  BLEPHARITIS; Right Upper Lid, Right Lower Lid, Left Upper Lid, Left Lower Lid (H01.001, H01.002,H01.004,H01.005) 4.  Pinguecula; Both Eyes (H11.153) 5.  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  ASTIGMATISM, REGULAR; Both Eyes (H52.223)  Plan: 1.  Cataract accounts for the patient's decreased vision. This visual impairment is not correctable with a tolerable change in glasses or contact lenses. Cataract surgery with an implantation of a new lens should significantly improve the visual and functional status of the patient. Discussed all risks, benefits, alternatives, and potential complications. Discussed the procedures and recovery. Patient desires to have surgery. A-scan ordered and performed today for intra-ocular lens calculations. The surgery will be performed in order to improve vision for driving, reading, and for eye examinations. Recommend phacoemulsification with intra-ocular lens. Recommend Dextenza for post-operative pain and inflammation. Left Eye worse - first Dilates poorly - shugarcaine by protocol. Malyugin Ring. Omidira. Possible toric IOL (Eyhance)  2.  with RPE changes OD. OCT macula today shows only dry disease. AREDS 2 vitamins. Amsler grid testing weekly. Call with any worsening vision, pain, or any other concerns.  3.  Blepharitis is present - recommend regular lid cleaning.  4.  Observe; Artificial tears as needed for irritation.  5.  Mild, OD only. Monitor.  6.  Will toric toric IOL calculator to see if toric IOL is recommended.

## 2023-07-29 ENCOUNTER — Encounter (HOSPITAL_COMMUNITY)
Admission: RE | Disposition: A | Discharge status: Home / Self Care | Payer: Self-pay | Source: Ambulatory Visit | Attending: Ophthalmology

## 2023-07-29 ENCOUNTER — Ambulatory Visit (HOSPITAL_COMMUNITY)
Admission: RE | Admit: 2023-07-29 | Discharge: 2023-07-29 | Disposition: A | Discharge status: Home / Self Care | Payer: PPO | Source: Ambulatory Visit | Attending: Ophthalmology | Admitting: Ophthalmology

## 2023-07-29 ENCOUNTER — Ambulatory Visit (HOSPITAL_COMMUNITY): Payer: PPO | Admitting: Certified Registered"

## 2023-07-29 ENCOUNTER — Ambulatory Visit (HOSPITAL_BASED_OUTPATIENT_CLINIC_OR_DEPARTMENT_OTHER): Payer: PPO | Admitting: Certified Registered"

## 2023-07-29 DIAGNOSIS — Z87891 Personal history of nicotine dependence: Secondary | ICD-10-CM | POA: Diagnosis not present

## 2023-07-29 DIAGNOSIS — H11153 Pinguecula, bilateral: Secondary | ICD-10-CM | POA: Diagnosis not present

## 2023-07-29 DIAGNOSIS — H25813 Combined forms of age-related cataract, bilateral: Secondary | ICD-10-CM | POA: Diagnosis not present

## 2023-07-29 DIAGNOSIS — I10 Essential (primary) hypertension: Secondary | ICD-10-CM | POA: Diagnosis not present

## 2023-07-29 DIAGNOSIS — H2512 Age-related nuclear cataract, left eye: Secondary | ICD-10-CM | POA: Diagnosis not present

## 2023-07-29 DIAGNOSIS — H52223 Regular astigmatism, bilateral: Secondary | ICD-10-CM | POA: Diagnosis not present

## 2023-07-29 DIAGNOSIS — E890 Postprocedural hypothyroidism: Secondary | ICD-10-CM | POA: Diagnosis not present

## 2023-07-29 DIAGNOSIS — H5712 Ocular pain, left eye: Secondary | ICD-10-CM | POA: Diagnosis not present

## 2023-07-29 DIAGNOSIS — Z79899 Other long term (current) drug therapy: Secondary | ICD-10-CM | POA: Diagnosis not present

## 2023-07-29 DIAGNOSIS — H0100A Unspecified blepharitis right eye, upper and lower eyelids: Secondary | ICD-10-CM | POA: Diagnosis not present

## 2023-07-29 DIAGNOSIS — H353132 Nonexudative age-related macular degeneration, bilateral, intermediate dry stage: Secondary | ICD-10-CM | POA: Insufficient documentation

## 2023-07-29 DIAGNOSIS — H25812 Combined forms of age-related cataract, left eye: Secondary | ICD-10-CM

## 2023-07-29 DIAGNOSIS — H18519 Endothelial corneal dystrophy, unspecified eye: Secondary | ICD-10-CM | POA: Insufficient documentation

## 2023-07-29 DIAGNOSIS — H0100B Unspecified blepharitis left eye, upper and lower eyelids: Secondary | ICD-10-CM | POA: Insufficient documentation

## 2023-07-29 SURGERY — CATARACT EXTRACTION PHACO AND INTRAOCULAR LENS PLACEMENT (IOC) with placement of Corticosteroid
Anesthesia: Monitor Anesthesia Care | Site: Eye | Laterality: Left

## 2023-07-29 MED ORDER — LIDOCAINE HCL (PF) 1 % IJ SOLN
INTRAOCULAR | Status: DC | PRN
  Administered 2023-07-29: 1 mL via OPHTHALMIC

## 2023-07-29 MED ORDER — POVIDONE-IODINE 5 % OP SOLN
OPHTHALMIC | Status: DC | PRN
  Administered 2023-07-29: 1 via OPHTHALMIC

## 2023-07-29 MED ORDER — PHENYLEPHRINE HCL 2.5 % OP SOLN
1.0000 [drp] | OPHTHALMIC | Status: AC | PRN
  Administered 2023-07-29 (×3): 1 [drp] via OPHTHALMIC

## 2023-07-29 MED ORDER — LIDOCAINE HCL 3.5 % OP GEL: 1.0000 | Freq: Once | OPHTHALMIC | Status: DC

## 2023-07-29 MED ORDER — LACTATED RINGERS IV SOLN: INTRAVENOUS | Status: DC

## 2023-07-29 MED ORDER — ORAL CARE MOUTH RINSE: 15.0000 mL | Freq: Once | OROMUCOSAL | Status: DC

## 2023-07-29 MED ORDER — DEXAMETHASONE 0.4 MG OP INST
VAGINAL_INSERT | OPHTHALMIC | Status: AC
  Filled 2023-07-29: qty 1

## 2023-07-29 MED ORDER — BSS IO SOLN
INTRAOCULAR | Status: DC | PRN
  Administered 2023-07-29: 15 mL via INTRAOCULAR

## 2023-07-29 MED ORDER — TROPICAMIDE 1 % OP SOLN
1.0000 [drp] | OPHTHALMIC | Status: AC | PRN
  Administered 2023-07-29 (×3): 1 [drp] via OPHTHALMIC

## 2023-07-29 MED ORDER — MIDAZOLAM HCL 2 MG/2ML IJ SOLN
INTRAMUSCULAR | Status: AC
  Filled 2023-07-29: qty 2

## 2023-07-29 MED ORDER — SODIUM HYALURONATE 23MG/ML IO SOSY
PREFILLED_SYRINGE | INTRAOCULAR | Status: DC | PRN
  Administered 2023-07-29: .6 mL via INTRAOCULAR

## 2023-07-29 MED ORDER — TETRACAINE HCL 0.5 % OP SOLN
1.0000 [drp] | OPHTHALMIC | Status: AC | PRN
  Administered 2023-07-29 (×3): 1 [drp] via OPHTHALMIC

## 2023-07-29 MED ORDER — DEXAMETHASONE 0.4 MG OP INST
VAGINAL_INSERT | OPHTHALMIC | Status: DC | PRN
  Administered 2023-07-29: .4 mg via OPHTHALMIC

## 2023-07-29 MED ORDER — MOXIFLOXACIN HCL 5 MG/ML IO SOLN
INTRAOCULAR | Status: DC | PRN
  Administered 2023-07-29: .3 mL via OPHTHALMIC

## 2023-07-29 MED ORDER — CHLORHEXIDINE GLUCONATE 0.12 % MT SOLN: 15.0000 mL | Freq: Once | OROMUCOSAL | Status: DC

## 2023-07-29 MED ORDER — SODIUM HYALURONATE 10 MG/ML IO SOLUTION
PREFILLED_SYRINGE | INTRAOCULAR | Status: DC | PRN
  Administered 2023-07-29: .85 mL via INTRAOCULAR

## 2023-07-29 MED ORDER — EPINEPHRINE PF 1 MG/ML IJ SOLN
INTRAOCULAR | Status: DC | PRN
  Administered 2023-07-29: 500 mL

## 2023-07-29 MED ORDER — STERILE WATER FOR IRRIGATION IR SOLN
Status: DC | PRN
  Administered 2023-07-29: 250 mL

## 2023-07-29 SURGICAL SUPPLY — 13 items
CATARACT SUITE SIGHTPATH (MISCELLANEOUS) ×1
CLOTH BEACON ORANGE TIMEOUT ST (SAFETY) ×1 IMPLANT
EYE SHIELD UNIVERSAL CLEAR (GAUZE/BANDAGES/DRESSINGS) IMPLANT
FEE CATARACT SUITE SIGHTPATH (MISCELLANEOUS) ×1 IMPLANT
GLOVE BIOGEL PI IND STRL 7.0 (GLOVE) ×2 IMPLANT
LENS IOL TECNIS EYHANCE 23.0 (Intraocular Lens) IMPLANT
NDL HYPO 18GX1.5 BLUNT FILL (NEEDLE) ×1 IMPLANT
NEEDLE HYPO 18GX1.5 BLUNT FILL (NEEDLE) ×1
PAD ARMBOARD 7.5X6 YLW CONV (MISCELLANEOUS) ×1 IMPLANT
POSITIONER HEAD 8X9X4 ADT (SOFTGOODS) ×1 IMPLANT
SYR TB 1ML LL NO SAFETY (SYRINGE) ×1 IMPLANT
TAPE SURG TRANSPORE 1 IN (GAUZE/BANDAGES/DRESSINGS) IMPLANT
WATER STERILE IRR 250ML POUR (IV SOLUTION) ×1 IMPLANT

## 2023-07-29 NOTE — Op Note (Signed)
Date of procedure: 07/29/23  Pre-operative diagnosis: Visually significant age-related combined cataract, Left Eye (H25.812)  Post-operative diagnosis:  Visually significant age-related combined cataract, Left Eye (H25.812) 2.   Pain and inflammation following cataract surgery, Left Eye (H57.12)  Procedure:  Removal of cataract via phacoemulsification and insertion of intra-ocular lens [employer] DIB00 +23.0D into the capsular bag of the Left Eye 2. Placement of Dextenza Implant, Left Lower Lid  Attending surgeon: Rudy Jew. Anaissa Macfadden, MD, MA  Anesthesia: MAC, Topical Akten  Complications: None  Estimated Blood Loss: <100mL (minimal)  Specimens: None  Implants: As above  Indications:  Visually significant age-related cataract, Left Eye  Procedure:  The patient was seen and identified in the pre-operative area. The operative eye was identified and dilated.  The operative eye was marked.  Topical anesthesia was administered to the operative eye.     The patient was then to the operative suite and placed in the supine position.  A timeout was performed confirming the patient, procedure to be performed, and all other relevant information.   The patient's face was prepped and draped in the usual fashion for intra-ocular surgery.  A lid speculum was placed into the operative eye and the surgical microscope moved into place and focused.  An inferotemporal paracentesis was created using a 20 gauge paracentesis blade.  Shugarcaine was injected into the anterior chamber.  Viscoelastic was injected into the anterior chamber.  A temporal clear-corneal main wound incision was created using a 2.47mm microkeratome.  A continuous curvilinear capsulorrhexis was initiated using an irrigating cystitome and completed using capsulorrhexis forceps.  Hydrodissection and hydrodeliniation were performed.  Viscoelastic was injected into the anterior chamber.  A phacoemulsification handpiece and a chopper as a  second instrument were used to remove the nucleus and epinucleus. The irrigation/aspiration handpiece was used to remove any remaining cortical material.   The capsular bag was reinflated with viscoelastic, checked, and found to be intact.  The intraocular lens was inserted into the capsular bag.  The irrigation/aspiration handpiece was used to remove any remaining viscoelastic.  The clear corneal wound and paracentesis wounds were then hydrated and checked with Weck-Cels to be watertight.  0.30mL of moxifloxacin was injected into the anterior chamber.  The lid-speculum was removed. The lower punctum was dilated. A Dextenza implant was placed in the lower canaliculus without complication.   The drape was removed.  The patient's face was cleaned with a wet and dry 4x4.   A clear shield was taped over the eye. The patient was taken to the post-operative care unit in good condition, having tolerated the procedure well.  Post-Op Instructions: The patient will follow up at [facility] for a same day post-operative evaluation and will receive all other orders and instructions.

## 2023-07-29 NOTE — Anesthesia Postprocedure Evaluation (Signed)
Anesthesia Post Note  Patient: MERCIA DOWE  Procedure(s) Performed: CATARACT EXTRACTION PHACO AND INTRAOCULAR LENS PLACEMENT (IOC) with placement of Corticosteroid (Left: Eye)  Patient location during evaluation: PACU Anesthesia Type: MAC Level of consciousness: awake and alert Pain management: pain level controlled Vital Signs Assessment: post-procedure vital signs reviewed and stable Respiratory status: spontaneous breathing, nonlabored ventilation, respiratory function stable and patient connected to nasal cannula oxygen Cardiovascular status: stable and blood pressure returned to baseline Postop Assessment: no apparent nausea or vomiting Anesthetic complications: no   There were no known notable events for this encounter.   Last Vitals:  Vitals:   07/29/23 1000 07/29/23 1036  BP: (!) 198/67 (!) 205/72  Pulse: (!) 52 (!) 57  Resp: 14 14  Temp:  36.6 C  SpO2: 98%     Last Pain:  Vitals:   07/29/23 1036  TempSrc: Oral  PainSc: 0-No pain                 Elin Seats L Dionysios Massman

## 2023-07-29 NOTE — Interval H&P Note (Signed)
History and Physical Interval Note:  07/29/2023 10:09 AM  Kendra Hancock  has presented today for surgery, with the diagnosis of combined forms age related cataract, left eye.  The various methods of treatment have been discussed with the patient and family. After consideration of risks, benefits and other options for treatment, the patient has consented to  Procedure(s): CATARACT EXTRACTION PHACO AND INTRAOCULAR LENS PLACEMENT (IOC) with placement of Corticosteroid (Left) as a surgical intervention.  The patient's history has been reviewed, patient examined, no change in status, stable for surgery.  I have reviewed the patient's chart and labs.  Questions were answered to the patient's satisfaction.     [name]

## 2023-07-29 NOTE — Anesthesia Procedure Notes (Signed)
Procedure Name: MAC Date/Time: 07/29/2023 10:14 AM  Performed by: Julian Reil, CRNAPre-anesthesia Checklist: Patient identified, Emergency Drugs available, Suction available and Patient being monitored Patient Re-evaluated:Patient Re-evaluated prior to induction Oxygen Delivery Method: Nasal cannula Placement Confirmation: positive ETCO2

## 2023-07-29 NOTE — Transfer of Care (Signed)
Immediate Anesthesia Transfer of Care Note  Patient: Kendra Hancock  Procedure(s) Performed: CATARACT EXTRACTION PHACO AND INTRAOCULAR LENS PLACEMENT (IOC) with placement of Corticosteroid (Left: Eye)  Patient Location: Short Stay  Anesthesia Type:MAC  Level of Consciousness: awake, alert , and oriented  Airway & Oxygen Therapy: Patient Spontanous Breathing  Post-op Assessment: Report given to RN and Post -op Vital signs reviewed and stable  Post vital signs: Reviewed and stable  Last Vitals:  Vitals Value Taken Time  BP    Temp    Pulse    Resp    SpO2      Last Pain:  Vitals:   07/29/23 0925  TempSrc: Oral  PainSc: 0-No pain         Complications: No notable events documented.

## 2023-07-29 NOTE — Anesthesia Preprocedure Evaluation (Signed)
Anesthesia Evaluation  Patient identified by MRN, date of birth, ID band Patient awake    Reviewed: Allergy & Precautions, NPO status , Patient's Chart, lab work & pertinent test results  Airway Mallampati: II  TM Distance: >3 FB Neck ROM: Full  Mouth opening: Limited Mouth Opening  Dental no notable dental hx. (+) Teeth Intact, Dental Advisory Given   Pulmonary neg pulmonary ROS, former smoker   Pulmonary exam normal breath sounds clear to auscultation       Cardiovascular Exercise Tolerance: Good hypertension, Pt. on medications negative cardio ROS Normal cardiovascular examI Rhythm:Regular Rate:Normal  Recently added Cardiazem for tighter BP control States anxious today Took 1/4 of a xanax this am  Reports being quite active  Denies CP/DOE   Neuro/Psych   Anxiety     negative neurological ROS  negative psych ROS   GI/Hepatic negative GI ROS, Neg liver ROS,,,  Endo/Other  negative endocrine ROSHypothyroidism    Renal/GU negative Renal ROS  negative genitourinary   Musculoskeletal negative musculoskeletal ROS (+) Arthritis , Osteoarthritis,    Abdominal   Peds negative pediatric ROS (+)  Hematology negative hematology ROS (+)   Anesthesia Other Findings   Reproductive/Obstetrics negative OB ROS                             Anesthesia Physical Anesthesia Plan  ASA: 3  Anesthesia Plan: MAC   Post-op Pain Management: Minimal or no pain anticipated   Induction: Intravenous  PONV Risk Score and Plan:   Airway Management Planned: Nasal Cannula and Natural Airway  Additional Equipment: None  Intra-op Plan:   Post-operative Plan:   Informed Consent: I have reviewed the patients History and Physical, chart, labs and discussed the procedure including the risks, benefits and alternatives for the proposed anesthesia with the patient or authorized representative who has indicated  his/her understanding and acceptance.     Dental advisory given  Plan Discussed with: CRNA  Anesthesia Plan Comments:         Anesthesia Quick Evaluation

## 2023-07-29 NOTE — Discharge Instructions (Signed)
Please discharge patient when stable, will follow up today with Dr. Carnella Fryman at the [facility] office immediately following discharge.  Leave shield in place until visit.  All paperwork with discharge instructions will be given at the office.  [facility] Address:  [address]  [place], [place] [address]  

## 2023-08-03 DIAGNOSIS — Z6825 Body mass index (BMI) 25.0-25.9, adult: Secondary | ICD-10-CM | POA: Diagnosis not present

## 2023-08-03 DIAGNOSIS — I10 Essential (primary) hypertension: Secondary | ICD-10-CM | POA: Diagnosis not present

## 2023-08-03 DIAGNOSIS — E785 Hyperlipidemia, unspecified: Secondary | ICD-10-CM | POA: Diagnosis not present

## 2023-08-03 DIAGNOSIS — E039 Hypothyroidism, unspecified: Secondary | ICD-10-CM | POA: Diagnosis not present

## 2023-08-11 DIAGNOSIS — Z23 Encounter for immunization: Secondary | ICD-10-CM | POA: Diagnosis not present

## 2023-08-15 DIAGNOSIS — H25811 Combined forms of age-related cataract, right eye: Secondary | ICD-10-CM | POA: Diagnosis not present

## 2023-08-16 NOTE — H&P (Signed)
Surgical History & Physical  Patient Name: Kendra Hancock  DOB: 11-27-1945  Surgery: Cataract extraction with intraocular lens implant phacoemulsification; Right Eye Surgeon: [name] MD Surgery Date: 08/22/2023 Pre-Op Date: 08/04/2023  HPI: A 64 Yr. old female patient 1. The patient is returning after cataract surgery. The left eye is affected. Since the last visit, the affected area is doing well. The patient's vision is stable. The complaint is associated with foreign body sensation, denies use of artificial tears. Patient is following medication instructions. The patient experiences no eye pain and no flashes, floater, shadow, curtain or veil. Patient reports that she has double vision between her eyes. Patient states that she has difficulty reading small print, filling out forms and seeing the television captioning OD. Patient states that her vision OD is blurred and hazy making it difficult to drive and see the street signs. Patient states that she has poor night vision as well. Patient states that she is bothered by the glare she receives both day and night from either the sun or headlights. HPI Completed by Dr. [name]  Medical History:  High Blood Pressure Thyroid Problems  Review of Systems Negative Allergic/Immunologic Hypertension Cardiovascular Negative Constitutional Negative Ear, Nose, Mouth & Throat Thyroid Disease Endocrine Negative Eyes Negative Gastrointestinal Negative Genitourinary Negative Hemotologic/Lymphatic Negative Integumentary Negative Musculoskeletal Negative Neurological Negative Psychiatry Negative Respiratory  Social Never smoked   Medication PreserVision AREDS ,  diltiazem HCl (Tiazac) ,  levothyroxine ,  hydrochlorothiazide ,  lisinopril , Prednisolon-moxiflox-bromf(pf)  Sx/Procedures Phaco c IOL OS with Dextenza,  Hystectomy, Gallbladder, Knee, Thyroid  Drug Allergies amoxicillin   History & Physical: Heent: cataract NECK:  supple without bruits LUNGS: lungs clear to auscultation CV: regular rate and rhythm Abdomen: soft and non-tender  Impression & Plan: Assessment: 1.  CATARACT EXTRACTION STATUS; Left Eye (Z98.42) 2.  COMBINED FORMS AGE RELATED CATARACT; Right Eye (H25.811) 3.  INTRAOCULAR LENS IOL (Z96.1)  Plan: 1.  1 week after cataract surgery. Doing well with improved vision and normal eye pressure. Call with any problems or concerns. Stop drops - Dextenza. Call with any concerning symptoms.  2.  Cataract accounts for the patient's decreased vision. This visual impairment is not correctable with a tolerable change in glasses or contact lenses. Cataract surgery with an implantation of a new lens should significantly improve the visual and functional status of the patient. Discussed all risks, benefits, alternatives, and potential complications. Discussed the procedures and recovery. Patient desires to have surgery. A-scan ordered and performed today for intra-ocular lens calculations. The surgery will be performed in order to improve vision for driving, reading, and for eye examinations. Recommend phacoemulsification with intra-ocular lens. Recommend Dextenza for post-operative pain and inflammation. Right Eye. Surgery required to correct imbalance of vision. Dilates well - shugarcaine by protocol.  3.  Doing well since surgery

## 2023-08-18 ENCOUNTER — Encounter (HOSPITAL_COMMUNITY)
Admission: RE | Admit: 2023-08-18 | Discharge: 2023-08-18 | Disposition: A | Discharge status: Home / Self Care | Payer: PPO | Source: Ambulatory Visit | Attending: Ophthalmology | Admitting: Ophthalmology

## 2023-08-18 ENCOUNTER — Encounter (HOSPITAL_COMMUNITY): Payer: Self-pay

## 2023-08-22 ENCOUNTER — Ambulatory Visit (HOSPITAL_COMMUNITY): Payer: PPO | Admitting: Certified Registered Nurse Anesthetist

## 2023-08-22 ENCOUNTER — Encounter (HOSPITAL_COMMUNITY): Payer: Self-pay | Admitting: Ophthalmology

## 2023-08-22 ENCOUNTER — Encounter (HOSPITAL_COMMUNITY)
Admission: RE | Disposition: A | Discharge status: Home / Self Care | Payer: Self-pay | Source: Home / Self Care | Attending: Ophthalmology

## 2023-08-22 ENCOUNTER — Ambulatory Visit (HOSPITAL_COMMUNITY)
Admission: RE | Admit: 2023-08-22 | Discharge: 2023-08-22 | Disposition: A | Discharge status: Home / Self Care | Payer: PPO | Attending: Ophthalmology | Admitting: Ophthalmology

## 2023-08-22 DIAGNOSIS — H5711 Ocular pain, right eye: Secondary | ICD-10-CM | POA: Diagnosis not present

## 2023-08-22 DIAGNOSIS — H2511 Age-related nuclear cataract, right eye: Secondary | ICD-10-CM

## 2023-08-22 DIAGNOSIS — Z87891 Personal history of nicotine dependence: Secondary | ICD-10-CM | POA: Insufficient documentation

## 2023-08-22 DIAGNOSIS — I10 Essential (primary) hypertension: Secondary | ICD-10-CM | POA: Diagnosis not present

## 2023-08-22 DIAGNOSIS — E039 Hypothyroidism, unspecified: Secondary | ICD-10-CM | POA: Diagnosis not present

## 2023-08-22 DIAGNOSIS — H25811 Combined forms of age-related cataract, right eye: Secondary | ICD-10-CM | POA: Diagnosis not present

## 2023-08-22 SURGERY — CATARACT EXTRACTION PHACO AND INTRAOCULAR LENS PLACEMENT (IOC) with placement of Corticosteroid
Anesthesia: Monitor Anesthesia Care | Site: Eye | Laterality: Right

## 2023-08-22 MED ORDER — LIDOCAINE HCL 3.5 % OP GEL
1.0000 | Freq: Once | OPHTHALMIC | Status: AC
  Administered 2023-08-22: 1 via OPHTHALMIC

## 2023-08-22 MED ORDER — TROPICAMIDE 1 % OP SOLN
1.0000 [drp] | OPHTHALMIC | Status: AC | PRN
  Administered 2023-08-22 (×3): 1 [drp] via OPHTHALMIC

## 2023-08-22 MED ORDER — LIDOCAINE HCL (PF) 1 % IJ SOLN
INTRAOCULAR | Status: DC | PRN
  Administered 2023-08-22: 1 mL via OPHTHALMIC

## 2023-08-22 MED ORDER — POVIDONE-IODINE 5 % OP SOLN
OPHTHALMIC | Status: DC | PRN
  Administered 2023-08-22: 1 via OPHTHALMIC

## 2023-08-22 MED ORDER — TETRACAINE HCL 0.5 % OP SOLN
1.0000 [drp] | OPHTHALMIC | Status: AC | PRN
  Administered 2023-08-22 (×3): 1 [drp] via OPHTHALMIC

## 2023-08-22 MED ORDER — BSS IO SOLN
INTRAOCULAR | Status: DC | PRN
  Administered 2023-08-22: 15 mL via INTRAOCULAR

## 2023-08-22 MED ORDER — MOXIFLOXACIN HCL 5 MG/ML IO SOLN
INTRAOCULAR | Status: DC | PRN
  Administered 2023-08-22: .3 mL via INTRACAMERAL

## 2023-08-22 MED ORDER — STERILE WATER FOR IRRIGATION IR SOLN
Status: DC | PRN
  Administered 2023-08-22: 1

## 2023-08-22 MED ORDER — PHENYLEPHRINE HCL 2.5 % OP SOLN
1.0000 [drp] | OPHTHALMIC | Status: AC | PRN
  Administered 2023-08-22 (×3): 1 [drp] via OPHTHALMIC

## 2023-08-22 MED ORDER — DEXAMETHASONE 0.4 MG OP INST
VAGINAL_INSERT | OPHTHALMIC | Status: DC | PRN
  Administered 2023-08-22: .4 mg via OPHTHALMIC

## 2023-08-22 MED ORDER — EPINEPHRINE PF 1 MG/ML IJ SOLN
INTRAOCULAR | Status: DC | PRN
  Administered 2023-08-22: 500 mL

## 2023-08-22 MED ORDER — SODIUM HYALURONATE 10 MG/ML IO SOLUTION
PREFILLED_SYRINGE | INTRAOCULAR | Status: DC | PRN
  Administered 2023-08-22: .85 mL via INTRAOCULAR

## 2023-08-22 MED ORDER — DEXAMETHASONE 0.4 MG OP INST
VAGINAL_INSERT | OPHTHALMIC | Status: AC
Start: 2023-08-22 — End: ?
  Filled 2023-08-22: qty 1

## 2023-08-22 MED ORDER — SODIUM HYALURONATE 23MG/ML IO SOSY
PREFILLED_SYRINGE | INTRAOCULAR | Status: DC | PRN
  Administered 2023-08-22: .6 mL via INTRAOCULAR

## 2023-08-22 SURGICAL SUPPLY — 15 items
CATARACT SUITE SIGHTPATH (MISCELLANEOUS) ×1
CLOTH BEACON ORANGE TIMEOUT ST (SAFETY) ×1 IMPLANT
EYE SHIELD UNIVERSAL CLEAR (GAUZE/BANDAGES/DRESSINGS) IMPLANT
FEE CATARACT SUITE SIGHTPATH (MISCELLANEOUS) ×1 IMPLANT
GLOVE BIOGEL PI IND STRL 7.0 (GLOVE) ×2 IMPLANT
LENS IOL TECNIS EYHANCE 22.5 (Intraocular Lens) IMPLANT
NDL HYPO 18GX1.5 BLUNT FILL (NEEDLE) ×1 IMPLANT
NEEDLE HYPO 18GX1.5 BLUNT FILL (NEEDLE) ×1
PAD ARMBOARD 7.5X6 YLW CONV (MISCELLANEOUS) ×1 IMPLANT
POSITIONER HEAD 8X9X4 ADT (SOFTGOODS) ×1 IMPLANT
RING MALYGIN (MISCELLANEOUS) IMPLANT
RING MALYGIN 7.0 (MISCELLANEOUS) IMPLANT
SYR TB 1ML LL NO SAFETY (SYRINGE) ×1 IMPLANT
TAPE SURG TRANSPORE 1 IN (GAUZE/BANDAGES/DRESSINGS) IMPLANT
WATER STERILE IRR 250ML POUR (IV SOLUTION) ×1 IMPLANT

## 2023-08-22 NOTE — Op Note (Signed)
Date of procedure: 08/22/23  Pre-operative diagnosis:  Visually significant nuclear age-related cataract, Right Eye (H25.11)  Post-operative diagnosis:   1. Visually significant nuclear  age-related cataract, Right Eye (H25.11) 2. Pain and inflammation following cataract surgery Right Eye (H57.11)  Procedure:  Removal of cataract via phacoemulsification and insertion of intra-ocular lens Johnson [employer] Johnson DIB00 +22.5D into the capsular bag of the Right Eye 2. Placement of Dextenza insert, Right Eye  Attending surgeon: Rudy Jew. Nuala Chiles, MD, MA  Anesthesia: MAC, Topical Akten  Complications: None  Estimated Blood Loss: <72mL (minimal)  Specimens: None  Implants: As above  Indications:  Visually significant age-related cataract, Right Eye  Procedure:  The patient was seen and identified in the pre-operative area. The operative eye was identified and dilated.  The operative eye was marked.  Topical anesthesia was administered to the operative eye.     The patient was then to the operative suite and placed in the supine position.  A timeout was performed confirming the patient, procedure to be performed, and all other relevant information.   The patient's face was prepped and draped in the usual fashion for intra-ocular surgery.  A lid speculum was placed into the operative eye and the surgical microscope moved into place and focused.  A superotemporal paracentesis was created using a 20 gauge paracentesis blade.  Shugarcaine was injected into the anterior chamber.  Viscoelastic was injected into the anterior chamber.  A temporal clear-corneal main wound incision was created using a 2.67mm microkeratome.  A continuous curvilinear capsulorrhexis was initiated using an irrigating cystitome and completed using capsulorrhexis forceps.  Hydrodissection and hydrodeliniation were performed.  Viscoelastic was injected into the anterior chamber.  A phacoemulsification handpiece and a chopper as a  second instrument were used to remove the nucleus and epinucleus. The irrigation/aspiration handpiece was used to remove any remaining cortical material.   The capsular bag was reinflated with viscoelastic, checked, and found to be intact.  The intraocular lens was inserted into the capsular bag.  The irrigation/aspiration handpiece was used to remove any remaining viscoelastic.  The clear corneal wound and paracentesis wounds were then hydrated and checked with Weck-Cels to be watertight. 0.36mL of moxifloxacin was injected into the anterior chamber.  The lid-speculum was removed. The lower punctum was dilated. A Dextenza implant was placed in the lower canaliculus without complication.  The drape was removed.  The patient's face was cleaned with a wet and dry 4x4. A clear shield was taped over the eye. The patient was taken to the post-operative care unit in good condition, having tolerated the procedure well.  Post-Op Instructions: The patient will follow up at [facility] for a same day post-operative evaluation and will receive all other orders and instructions.

## 2023-08-22 NOTE — Anesthesia Preprocedure Evaluation (Signed)
Anesthesia Evaluation  Patient identified by MRN, date of birth, ID band Patient awake    Reviewed: Allergy & Precautions, H&P , NPO status , Patient's Chart, lab work & pertinent test results, reviewed documented beta blocker date and time   Airway Mallampati: II  TM Distance: >3 FB Neck ROM: full    Dental no notable dental hx.    Pulmonary neg pulmonary ROS, former smoker   Pulmonary exam normal breath sounds clear to auscultation       Cardiovascular Exercise Tolerance: Good hypertension, negative cardio ROS  Rhythm:regular Rate:Normal     Neuro/Psych   Anxiety     negative neurological ROS  negative psych ROS   GI/Hepatic negative GI ROS, Neg liver ROS,,,  Endo/Other  negative endocrine ROSHypothyroidism    Renal/GU negative Renal ROS  negative genitourinary   Musculoskeletal   Abdominal   Peds  Hematology negative hematology ROS (+)   Anesthesia Other Findings   Reproductive/Obstetrics negative OB ROS                             Anesthesia Physical Anesthesia Plan  ASA: 2  Anesthesia Plan: MAC   Post-op Pain Management:    Induction:   PONV Risk Score and Plan:   Airway Management Planned:   Additional Equipment:   Intra-op Plan:   Post-operative Plan:   Informed Consent: I have reviewed the patients History and Physical, chart, labs and discussed the procedure including the risks, benefits and alternatives for the proposed anesthesia with the patient or authorized representative who has indicated his/her understanding and acceptance.     Dental Advisory Given  Plan Discussed with: CRNA  Anesthesia Plan Comments:        Anesthesia Quick Evaluation

## 2023-08-22 NOTE — Anesthesia Postprocedure Evaluation (Signed)
Anesthesia Post Note  Patient: SANDRIA CALLERY  Procedure(s) Performed: CATARACT EXTRACTION PHACO AND INTRAOCULAR LENS PLACEMENT (IOC) with placement of Corticosteroid (Right: Eye)  Patient location during evaluation: Phase II Anesthesia Type: MAC Level of consciousness: awake Pain management: pain level controlled Vital Signs Assessment: post-procedure vital signs reviewed and stable Respiratory status: spontaneous breathing and respiratory function stable Cardiovascular status: blood pressure returned to baseline and stable Postop Assessment: no headache and no apparent nausea or vomiting Anesthetic complications: no Comments: Late entry   No notable events documented.   Last Vitals:  Vitals:   08/22/23 0807 08/22/23 0921  BP: (!) 201/60 (!) 190/84  Pulse: (!) 53 (!) 59  Resp: 15 18  Temp: 36.7 C 36.6 C  SpO2: 100% 100%    Last Pain:  Vitals:   08/22/23 0926  TempSrc:   PainSc: 0-No pain                 Windell Norfolk

## 2023-08-22 NOTE — Interval H&P Note (Signed)
History and Physical Interval Note:  08/22/2023 8:52 AM  Kendra Hancock  has presented today for surgery, with the diagnosis of combined forms age related cataract, right eye.  The various methods of treatment have been discussed with the patient and family. After consideration of risks, benefits and other options for treatment, the patient has consented to  Procedure(s): CATARACT EXTRACTION PHACO AND INTRAOCULAR LENS PLACEMENT (IOC) with placement of Corticosteroid (Right) as a surgical intervention.  The patient's history has been reviewed, patient examined, no change in status, stable for surgery.  I have reviewed the patient's chart and labs.  Questions were answered to the patient's satisfaction.     [name]

## 2023-08-22 NOTE — Transfer of Care (Signed)
Immediate Anesthesia Transfer of Care Note  Patient: Kendra Hancock  Procedure(s) Performed: CATARACT EXTRACTION PHACO AND INTRAOCULAR LENS PLACEMENT (IOC) with placement of Corticosteroid (Right: Eye)  Patient Location: Short Stay  Anesthesia Type:MAC  Level of Consciousness: awake, alert , and oriented  Airway & Oxygen Therapy: Patient Spontanous Breathing  Post-op Assessment: Report given to RN and Post -op Vital signs reviewed and stable  Post vital signs: Reviewed and stable  Last Vitals:  Vitals Value Taken Time  BP 190/84 08/22/23 0921  Temp 36.6 C 08/22/23 0921  Pulse 59 08/22/23 0921  Resp 18 08/22/23 0921  SpO2 100 % 08/22/23 0921    Last Pain:  Vitals:   08/22/23 0921  TempSrc: Oral  PainSc:          Complications: No notable events documented.

## 2023-08-22 NOTE — Discharge Instructions (Signed)
Please discharge patient when stable, will follow up today with Dr. Carnella Fryman at the [facility] office immediately following discharge.  Leave shield in place until visit.  All paperwork with discharge instructions will be given at the office.  [facility] Address:  [address]  [place], [place] [address]  

## 2023-08-25 DIAGNOSIS — Z85820 Personal history of malignant melanoma of skin: Secondary | ICD-10-CM | POA: Diagnosis not present

## 2023-08-25 DIAGNOSIS — Z1283 Encounter for screening for malignant neoplasm of skin: Secondary | ICD-10-CM | POA: Diagnosis not present

## 2023-08-25 DIAGNOSIS — X32XXXD Exposure to sunlight, subsequent encounter: Secondary | ICD-10-CM | POA: Diagnosis not present

## 2023-08-25 DIAGNOSIS — Z08 Encounter for follow-up examination after completed treatment for malignant neoplasm: Secondary | ICD-10-CM | POA: Diagnosis not present

## 2023-08-25 DIAGNOSIS — L570 Actinic keratosis: Secondary | ICD-10-CM | POA: Diagnosis not present

## 2023-08-25 DIAGNOSIS — Z85828 Personal history of other malignant neoplasm of skin: Secondary | ICD-10-CM | POA: Diagnosis not present

## 2023-11-24 DIAGNOSIS — L308 Other specified dermatitis: Secondary | ICD-10-CM | POA: Diagnosis not present

## 2023-11-24 DIAGNOSIS — C4442 Squamous cell carcinoma of skin of scalp and neck: Secondary | ICD-10-CM | POA: Diagnosis not present

## 2023-12-29 DIAGNOSIS — Z85820 Personal history of malignant melanoma of skin: Secondary | ICD-10-CM | POA: Diagnosis not present

## 2023-12-29 DIAGNOSIS — Z85828 Personal history of other malignant neoplasm of skin: Secondary | ICD-10-CM | POA: Diagnosis not present

## 2023-12-29 DIAGNOSIS — Z08 Encounter for follow-up examination after completed treatment for malignant neoplasm: Secondary | ICD-10-CM | POA: Diagnosis not present

## 2024-01-11 DIAGNOSIS — C4442 Squamous cell carcinoma of skin of scalp and neck: Secondary | ICD-10-CM | POA: Diagnosis not present

## 2024-02-03 DIAGNOSIS — C4442 Squamous cell carcinoma of skin of scalp and neck: Secondary | ICD-10-CM | POA: Diagnosis not present

## 2024-02-21 DIAGNOSIS — E559 Vitamin D deficiency, unspecified: Secondary | ICD-10-CM | POA: Diagnosis not present

## 2024-02-21 DIAGNOSIS — Z1331 Encounter for screening for depression: Secondary | ICD-10-CM | POA: Diagnosis not present

## 2024-02-21 DIAGNOSIS — E039 Hypothyroidism, unspecified: Secondary | ICD-10-CM | POA: Diagnosis not present

## 2024-02-21 DIAGNOSIS — E7849 Other hyperlipidemia: Secondary | ICD-10-CM | POA: Diagnosis not present

## 2024-02-21 DIAGNOSIS — E782 Mixed hyperlipidemia: Secondary | ICD-10-CM | POA: Diagnosis not present

## 2024-02-21 DIAGNOSIS — Z6826 Body mass index (BMI) 26.0-26.9, adult: Secondary | ICD-10-CM | POA: Diagnosis not present

## 2024-02-21 DIAGNOSIS — F419 Anxiety disorder, unspecified: Secondary | ICD-10-CM | POA: Diagnosis not present

## 2024-02-21 DIAGNOSIS — D473 Essential (hemorrhagic) thrombocythemia: Secondary | ICD-10-CM | POA: Diagnosis not present

## 2024-02-21 DIAGNOSIS — I10 Essential (primary) hypertension: Secondary | ICD-10-CM | POA: Diagnosis not present

## 2024-02-21 DIAGNOSIS — Z0001 Encounter for general adult medical examination with abnormal findings: Secondary | ICD-10-CM | POA: Diagnosis not present

## 2024-02-21 DIAGNOSIS — E663 Overweight: Secondary | ICD-10-CM | POA: Diagnosis not present

## 2024-03-02 ENCOUNTER — Other Ambulatory Visit (HOSPITAL_COMMUNITY): Payer: Self-pay | Admitting: Family Medicine

## 2024-03-02 DIAGNOSIS — Z1231 Encounter for screening mammogram for malignant neoplasm of breast: Secondary | ICD-10-CM

## 2024-03-05 ENCOUNTER — Ambulatory Visit (HOSPITAL_COMMUNITY)
Admission: RE | Admit: 2024-03-05 | Discharge: 2024-03-05 | Disposition: A | Discharge status: Home / Self Care | Source: Ambulatory Visit | Attending: Family Medicine | Admitting: Family Medicine

## 2024-03-05 DIAGNOSIS — Z1231 Encounter for screening mammogram for malignant neoplasm of breast: Secondary | ICD-10-CM | POA: Insufficient documentation

## 2024-03-27 DIAGNOSIS — H353111 Nonexudative age-related macular degeneration, right eye, early dry stage: Secondary | ICD-10-CM | POA: Diagnosis not present

## 2024-04-24 DIAGNOSIS — E890 Postprocedural hypothyroidism: Secondary | ICD-10-CM | POA: Diagnosis not present

## 2024-04-24 DIAGNOSIS — Z87891 Personal history of nicotine dependence: Secondary | ICD-10-CM | POA: Diagnosis not present

## 2024-04-24 DIAGNOSIS — F5101 Primary insomnia: Secondary | ICD-10-CM | POA: Diagnosis not present

## 2024-04-24 DIAGNOSIS — Z7689 Persons encountering health services in other specified circumstances: Secondary | ICD-10-CM | POA: Diagnosis not present

## 2024-04-24 DIAGNOSIS — I10 Essential (primary) hypertension: Secondary | ICD-10-CM | POA: Diagnosis not present

## 2024-07-06 DIAGNOSIS — Z7689 Persons encountering health services in other specified circumstances: Secondary | ICD-10-CM | POA: Diagnosis not present

## 2024-07-13 DIAGNOSIS — F5101 Primary insomnia: Secondary | ICD-10-CM | POA: Diagnosis not present

## 2024-07-13 DIAGNOSIS — I10 Essential (primary) hypertension: Secondary | ICD-10-CM | POA: Diagnosis not present

## 2024-07-13 DIAGNOSIS — E782 Mixed hyperlipidemia: Secondary | ICD-10-CM | POA: Diagnosis not present

## 2024-07-13 DIAGNOSIS — E890 Postprocedural hypothyroidism: Secondary | ICD-10-CM | POA: Diagnosis not present

## 2024-07-26 DIAGNOSIS — Z08 Encounter for follow-up examination after completed treatment for malignant neoplasm: Secondary | ICD-10-CM | POA: Diagnosis not present

## 2024-07-26 DIAGNOSIS — D225 Melanocytic nevi of trunk: Secondary | ICD-10-CM | POA: Diagnosis not present

## 2024-07-26 DIAGNOSIS — Z1283 Encounter for screening for malignant neoplasm of skin: Secondary | ICD-10-CM | POA: Diagnosis not present

## 2024-07-26 DIAGNOSIS — X32XXXD Exposure to sunlight, subsequent encounter: Secondary | ICD-10-CM | POA: Diagnosis not present

## 2024-07-26 DIAGNOSIS — L570 Actinic keratosis: Secondary | ICD-10-CM | POA: Diagnosis not present

## 2024-07-26 DIAGNOSIS — Z85820 Personal history of malignant melanoma of skin: Secondary | ICD-10-CM | POA: Diagnosis not present

## 2024-07-26 DIAGNOSIS — Z85828 Personal history of other malignant neoplasm of skin: Secondary | ICD-10-CM | POA: Diagnosis not present
